# Patient Record
Sex: Female | Born: 1980 | Race: White | Hispanic: No | Smoking: Current every day smoker
Health system: Southern US, Community
[De-identification: ages and names within clinical notes are randomized; demographics above are authoritative.]

## PROBLEM LIST (undated history)

## (undated) ENCOUNTER — Inpatient Hospital Stay (HOSPITAL_COMMUNITY): Payer: Self-pay

## (undated) DIAGNOSIS — R87619 Unspecified abnormal cytological findings in specimens from cervix uteri: Secondary | ICD-10-CM

## (undated) DIAGNOSIS — O039 Complete or unspecified spontaneous abortion without complication: Secondary | ICD-10-CM

## (undated) DIAGNOSIS — F1911 Other psychoactive substance abuse, in remission: Secondary | ICD-10-CM

## (undated) DIAGNOSIS — M5126 Other intervertebral disc displacement, lumbar region: Secondary | ICD-10-CM

## (undated) DIAGNOSIS — J45909 Unspecified asthma, uncomplicated: Secondary | ICD-10-CM

## (undated) DIAGNOSIS — E669 Obesity, unspecified: Secondary | ICD-10-CM

## (undated) DIAGNOSIS — IMO0002 Reserved for concepts with insufficient information to code with codable children: Secondary | ICD-10-CM

## (undated) DIAGNOSIS — E039 Hypothyroidism, unspecified: Secondary | ICD-10-CM

## (undated) HISTORY — PX: CHOLECYSTECTOMY: SHX55

## (undated) HISTORY — PX: OTHER SURGICAL HISTORY: SHX169

---
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
## 2012-08-30 ENCOUNTER — Encounter (HOSPITAL_COMMUNITY): Payer: Self-pay | Admitting: *Deleted

## 2012-08-30 ENCOUNTER — Inpatient Hospital Stay (HOSPITAL_COMMUNITY): Payer: Medicaid Other

## 2012-08-30 ENCOUNTER — Inpatient Hospital Stay (HOSPITAL_COMMUNITY)
Admission: AD | Admit: 2012-08-30 | Discharge: 2012-08-31 | Disposition: A | Discharge status: Home / Self Care | Payer: Medicaid Other | Source: Ambulatory Visit | Attending: Obstetrics & Gynecology | Admitting: Obstetrics & Gynecology

## 2012-08-30 DIAGNOSIS — O209 Hemorrhage in early pregnancy, unspecified: Secondary | ICD-10-CM | POA: Insufficient documentation

## 2012-08-30 HISTORY — DX: Hypothyroidism, unspecified: E03.9

## 2012-08-30 HISTORY — DX: Unspecified abnormal cytological findings in specimens from cervix uteri: R87.619

## 2012-08-30 HISTORY — DX: Reserved for concepts with insufficient information to code with codable children: IMO0002

## 2012-08-30 HISTORY — DX: Other psychoactive substance abuse, in remission: F19.11

## 2012-08-30 HISTORY — DX: Unspecified asthma, uncomplicated: J45.909

## 2012-08-30 LAB — URINALYSIS, ROUTINE W REFLEX MICROSCOPIC
Bilirubin Urine: NEGATIVE
Glucose, UA: NEGATIVE mg/dL
Ketones, ur: NEGATIVE mg/dL
Nitrite: NEGATIVE
Protein, ur: 30 mg/dL — AB
Specific Gravity, Urine: 1.015 (ref 1.005–1.030)
Urobilinogen, UA: 0.2 mg/dL (ref 0.0–1.0)
pH: 7 (ref 5.0–8.0)

## 2012-08-30 LAB — URINE MICROSCOPIC-ADD ON

## 2012-08-30 LAB — CBC
HCT: 36.5 % (ref 36.0–46.0)
Hemoglobin: 12.3 g/dL (ref 12.0–15.0)
MCH: 29 pg (ref 26.0–34.0)
MCHC: 33.7 g/dL (ref 30.0–36.0)

## 2012-08-30 LAB — POCT PREGNANCY, URINE: Preg Test, Ur: POSITIVE — AB

## 2012-08-30 LAB — OB RESULTS CONSOLE GC/CHLAMYDIA: CHLAMYDIA, DNA PROBE: NEGATIVE

## 2012-08-30 NOTE — MAU Note (Signed)
Small amt brown blood on pad in triage

## 2012-08-30 NOTE — MAU Provider Note (Signed)
Chief Complaint: No chief complaint on file.   First Provider Initiated Contact with Patient 08/30/12 2137     SUBJECTIVE HPI: [name] [name] is a 32 y.o. G4P2002 at [redacted]w[redacted]d by uncertain LMP who presents with spotting x 1 week and increased DRB w/ small clots and cramping over the past 5 hours. Denies passage of tissue, abd pain, fever, chills. No recent IC. No prior testing this pregnancy.   Past Medical History  Diagnosis Date  . Hypothyroidism     no meds  . Abnormal Pap smear   . Asthma   . History of drug abuse     codiene   OB History    Grav Para Term Preterm Abortions TAB SAB Ect Mult Living   4 2 2       2      # Outc Date GA Lbr Len/2nd Wgt Sex Del Anes PTL Lv   1 GRA            2 TRM            3 TRM            4 CUR              Past Surgical History  Procedure Date  . Cholecystectomy    History   Social History  . Marital Status: Single    Spouse Name: N/A    Number of Children: N/A  . Years of Education: N/A   Occupational History  . Not on file.   Social History Main Topics  . Smoking status: Current Every Day Smoker    Types: Cigarettes  . Smokeless tobacco: Not on file  . Alcohol Use: No  . Drug Use: Yes    Special: Codeine  . Sexually Active: Yes    Birth Control/ Protection: None   Other Topics Concern  . Not on file   Social History Narrative  . No narrative on file   No current facility-administered medications on file prior to encounter.   No current outpatient prescriptions on file prior to encounter.   Allergies  Allergen Reactions  . Other Nausea And Vomiting    Allergic to anesthesia. Makes patient "violently nauseous"    ROS: Pertinent items in HPI  OBJECTIVE Blood pressure 137/94, pulse 91, temperature 98.1 F (36.7 C), resp. rate 20, height 5' 10.5" (1.791 m), weight 108.682 kg (239 lb 9.6 oz), last menstrual period 06/19/2012. GENERAL: Well-developed, well-nourished female in no acute distress. Anxious.  HEENT:  Normocephalic HEART: normal rate RESP: normal effort ABDOMEN: Soft, non-tender EXTREMITIES: Nontender, no edema NEURO: Alert and oriented SPECULUM EXAM: NEFG, small amount of dark red blood coming from os, normal odor, cervix friable. No mucopurulent discharge. BIMANUAL: cervix closed; UTA uterine size due to body habitus, no adnexal tenderness or masses  LAB RESULTS Results for orders placed during the hospital encounter of 08/30/12 (from the past 24 hour(s))  URINALYSIS, ROUTINE W REFLEX MICROSCOPIC     Status: Abnormal   Collection Time   08/30/12  8:20 PM      Component Value Range   Color, Urine ORANGE (*) YELLOW   APPearance CLEAR  CLEAR   Specific Gravity, Urine 1.015  1.005 - 1.030   pH 7.0  5.0 - 8.0   Glucose, UA NEGATIVE  NEGATIVE mg/dL   Hgb urine dipstick LARGE (*) NEGATIVE   Bilirubin Urine NEGATIVE  NEGATIVE   Ketones, ur NEGATIVE  NEGATIVE mg/dL   Protein, ur 30 (*) NEGATIVE mg/dL  Urobilinogen, UA 0.2  0.0 - 1.0 mg/dL   Nitrite NEGATIVE  NEGATIVE   Leukocytes, UA SMALL (*) NEGATIVE  URINE MICROSCOPIC-ADD ON     Status: Abnormal   Collection Time   08/30/12  8:20 PM      Component Value Range   Squamous Epithelial / LPF FEW (*) RARE   WBC, UA 7-10  <3 WBC/hpf   RBC / HPF 11-20  <3 RBC/hpf   Bacteria, UA FEW (*) RARE  POCT PREGNANCY, URINE     Status: Abnormal   Collection Time   08/30/12  9:18 PM      Component Value Range   Preg Test, Ur POSITIVE (*) NEGATIVE  CBC     Status: Normal   Collection Time   08/30/12  9:38 PM      Component Value Range   WBC 8.2  4.0 - 10.5 K/uL   RBC 4.24  3.87 - 5.11 MIL/uL   Hemoglobin 12.3  12.0 - 15.0 g/dL   HCT 40.9  81.1 - 91.4 %   MCV 86.1  78.0 - 100.0 fL   MCH 29.0  26.0 - 34.0 pg   MCHC 33.7  30.0 - 36.0 g/dL   RDW 78.2  95.6 - 21.3 %   Platelets 251  150 - 400 K/uL  ABO/RH     Status: Normal   Collection Time   08/30/12  9:38 PM      Component Value Range   ABO/RH(D) A POS    HCG, QUANTITATIVE, PREGNANCY      Status: Abnormal   Collection Time   08/30/12  9:38 PM      Component Value Range   hCG, Beta Chain, Quant, S 11086 (*) <5 mIU/mL    IMAGING US Ob Comp Less 14 Wks  08/30/2012  *RADIOLOGY REPORT*  Clinical Data: cramping and bleeding.  OBSTETRIC <14 WK ULTRASOUND  Technique:  Transabdominal ultrasound was performed for evaluation of the gestation as well as the maternal uterus and adnexal regions.  Comparison:  None.  Intrauterine gestational sac: Visualized. Yolk sac: Visualized Embryo: Not visualized Cardiac Activity: Not visualized  MSD: 15.9  mm  6 w  3 d  Maternal uterus/Adnexae: Unremarkable.  IMPRESSION: The absence of visualization of an embryo may be related to early gestational gauge.  The patient has not reached criteria for suspicion for nonviability (mean sac diameter between 16 and 24 mm and no embryo).  Follow up sonogram in 10-14 days and correlation with serial beta HCG is recommended for further delineation.   Original Report Authenticated By: Lacy Duverney, M.D.    US Ob Transvaginal  08/30/2012  *RADIOLOGY REPORT*  Clinical Data: cramping and bleeding.  OBSTETRIC <14 WK ULTRASOUND  Technique:  Transabdominal ultrasound was performed for evaluation of the gestation as well as the maternal uterus and adnexal regions.  Comparison:  None.  Intrauterine gestational sac: Visualized. Yolk sac: Visualized Embryo: Not visualized Cardiac Activity: Not visualized  MSD: 15.9  mm  6 w  3 d  Maternal uterus/Adnexae: Unremarkable.  IMPRESSION: The absence of visualization of an embryo may be related to early gestational gauge.  The patient has not reached criteria for suspicion for nonviability (mean sac diameter between 16 and 24 mm and no embryo).  Follow up sonogram in 10-14 days and correlation with serial beta HCG is recommended for further delineation.   Original Report Authenticated By: Lacy Duverney, M.D.    MAU COURSE  ASSESSMENT 1. Bleeding in early pregnancy  PLAN Discharge  home SAB precautions. Pelvic rest x 1 week.     Follow-up Information    Follow up with THE [facility] OF [place] MATERNITY ADMISSIONS. In 2 days. (for bloodwork)    Contact information:   [address] [id number] mc [place] [address] [phone]      Follow up with THE [facility] OF [place] ULTRASOUND. In 10 days.   Contact information:   [address] [id number] mc [place] [address] [phone]      Follow up with THE [facility] OF [place] MATERNITY ADMISSIONS. (for heavy bleeding and/or severe cramping)    Contact information:   [address] [id number] mc [place] [address] [phone]          Medication List     As of 08/31/2012 12:14 AM    TAKE these medications         buprenorphine 8 MG Subl   Commonly known as: SUBUTEX   Place 8 mg under the tongue daily.      calcium carbonate 500 MG chewable tablet   Commonly known as: TUMS - dosed in mg elemental calcium   Chew 1 tablet by mouth daily as needed. For heartburn        [place], [place] 08/31/2012  12:14 AM

## 2012-08-30 NOTE — MAU Note (Signed)
Have had off and on spotting for a week. For past day more spotting and for past 5 hours more bright blood on the pad. Mild cramping

## 2012-08-31 DIAGNOSIS — O209 Hemorrhage in early pregnancy, unspecified: Secondary | ICD-10-CM

## 2012-08-31 LAB — WET PREP, GENITAL: Yeast Wet Prep HPF POC: NONE SEEN

## 2012-08-31 LAB — GC/CHLAMYDIA PROBE AMP: CT Probe RNA: NEGATIVE

## 2012-09-01 LAB — URINE CULTURE: Colony Count: 7000

## 2012-09-02 ENCOUNTER — Encounter (HOSPITAL_COMMUNITY): Payer: Self-pay | Admitting: Medical

## 2012-09-02 ENCOUNTER — Inpatient Hospital Stay (HOSPITAL_COMMUNITY)
Admission: AD | Admit: 2012-09-02 | Discharge: 2012-09-02 | Disposition: A | Discharge status: Home / Self Care | Payer: Self-pay | Source: Ambulatory Visit | Attending: Obstetrics & Gynecology | Admitting: Obstetrics & Gynecology

## 2012-09-02 DIAGNOSIS — O209 Hemorrhage in early pregnancy, unspecified: Secondary | ICD-10-CM

## 2012-09-02 NOTE — MAU Note (Signed)
After PA reviewed chart, patient does not need a BHCG today and has an appointment on 2-4 for a follow up ultrasound.

## 2012-09-02 NOTE — MAU Provider Note (Signed)
Attestation of Attending Supervision of Advanced Practitioner (CNM/NP): Evaluation and management procedures were performed by the Advanced Practitioner under my supervision and collaboration. I have reviewed the Advanced Practitioner's note and chart, and I agree with the management and plan.  Shelsea Hangartner H. 10:41 PM   

## 2012-09-02 NOTE — MAU Provider Note (Signed)
Subjective: [name] [name] is a 32 y.o. [id number] at [redacted]w[redacted]d who presents to MAU today for follow-up. The patient was seen 2 days ago and had quant hCG and [place] at that time. US showed IUGS and IUYS. The patient already has a follow-up US scheduled for 09/10/12 @ 2:15 pm. The patient is having minimal bleeding today and no abdominal pain. The patient denies fever, N/V. Bleeding has improved since last visit. Patient requests letter for pregnancy confirmation for work. Patient is upset because she was told by the front office staff that she would not be here long because we weren't very busy and states that she waited 45 minutes before being called back to triage. At that time she had not yet had labs drawn, which was why she thought she was here.   Objective: LMP 06/19/2012 GENERAL: Well-developed, well-nourished female in no acute distress.  HEENT: Normocephalic, atraumatic.Marland Kitchen  LUNGS: Normal effort.  HEART: Regular rate.  Very small amount of bleeding seen on pad. Patient has had this pad on x 3-4 hours.   Assessment: IUGS seen on previous US.   Plan: Discharge home Follow-up quant hCG is not needed today since IUGS and IUYS seen on last [place] I have reassured the patient that this is the appropriate follow-up and that we apologize for the wait and inconvenience. The patient is much more understanding and calm prior to discharge.  Patient will follow-up for next [place] as scheduled on 09/09/12 @ 2:15 pm Pregnancy confirmation letter given Bleeding precautions discussed  Freddi Starr, PA-C 09/02/2012 5:10 PM

## 2012-09-10 ENCOUNTER — Encounter (HOSPITAL_COMMUNITY): Payer: Self-pay | Admitting: *Deleted

## 2012-09-10 ENCOUNTER — Ambulatory Visit (HOSPITAL_COMMUNITY)
Admission: RE | Admit: 2012-09-10 | Discharge: 2012-09-10 | Disposition: A | Discharge status: Home / Self Care | Payer: Medicaid Other | Source: Ambulatory Visit | Attending: Advanced Practice Midwife | Admitting: Advanced Practice Midwife

## 2012-09-10 ENCOUNTER — Inpatient Hospital Stay (HOSPITAL_COMMUNITY)
Admission: AD | Admit: 2012-09-10 | Discharge: 2012-09-10 | Disposition: A | Discharge status: Home / Self Care | Payer: Medicaid Other | Source: Ambulatory Visit | Attending: Family Medicine | Admitting: Family Medicine

## 2012-09-10 DIAGNOSIS — Z3689 Encounter for other specified antenatal screening: Secondary | ICD-10-CM | POA: Insufficient documentation

## 2012-09-10 DIAGNOSIS — O209 Hemorrhage in early pregnancy, unspecified: Secondary | ICD-10-CM | POA: Insufficient documentation

## 2012-09-10 DIAGNOSIS — O039 Complete or unspecified spontaneous abortion without complication: Secondary | ICD-10-CM

## 2012-09-10 DIAGNOSIS — O3680X0 Pregnancy with inconclusive fetal viability, not applicable or unspecified: Secondary | ICD-10-CM | POA: Insufficient documentation

## 2012-09-10 NOTE — MAU Provider Note (Signed)
Chart reviewed and agree with management and plan.  

## 2012-09-10 NOTE — MAU Provider Note (Signed)
[name] [name] is a 32 y.o. female who returns to MAU for follow up ultrasound. She continues to have vaginal bleeding but light. On her previous visit  08/30/12 ultrasound showed an IUGS with YS. Ultrasound today shows no IUGS. Consistent with complete SAB  BP 128/81  Pulse 83  Temp 98 F (36.7 C) (Oral)  Resp 20  Ht 5' 10.5" (1.791 m)  Wt 245 lb (111.131 kg)  BMI 34.66 kg/m2  SpO2 100%  LMP 06/19/2012   Assessment: SAB  Plan:  Discussed ultrasound findings with the patient and follow up in the GYN office in 2 weeks   Patient voices understanding   Comfort packet given  I have reviewed this patient's vital signs, nurses notes, appropriate labs and imaging.

## 2012-09-10 NOTE — MAU Note (Signed)
Pt states was seen here last week and had u/s scheduled. Unknown LMP, hx irregular cycles per pt. Bleeding today small-moderate amount, not enough to fill pad, having LLQ pain. Also notes h/a and swelling in legs and hands

## 2012-10-15 ENCOUNTER — Emergency Department (HOSPITAL_COMMUNITY)
Admission: EM | Admit: 2012-10-15 | Discharge: 2012-10-16 | Disposition: A | Discharge status: Home / Self Care | Payer: Medicaid Other | Attending: Emergency Medicine | Admitting: Emergency Medicine

## 2012-10-15 ENCOUNTER — Encounter (HOSPITAL_COMMUNITY): Payer: Self-pay | Admitting: *Deleted

## 2012-10-15 DIAGNOSIS — Z862 Personal history of diseases of the blood and blood-forming organs and certain disorders involving the immune mechanism: Secondary | ICD-10-CM | POA: Insufficient documentation

## 2012-10-15 DIAGNOSIS — J45909 Unspecified asthma, uncomplicated: Secondary | ICD-10-CM | POA: Insufficient documentation

## 2012-10-15 DIAGNOSIS — F1910 Other psychoactive substance abuse, uncomplicated: Secondary | ICD-10-CM | POA: Insufficient documentation

## 2012-10-15 DIAGNOSIS — Z8639 Personal history of other endocrine, nutritional and metabolic disease: Secondary | ICD-10-CM | POA: Insufficient documentation

## 2012-10-15 DIAGNOSIS — O039 Complete or unspecified spontaneous abortion without complication: Secondary | ICD-10-CM | POA: Insufficient documentation

## 2012-10-15 DIAGNOSIS — F17200 Nicotine dependence, unspecified, uncomplicated: Secondary | ICD-10-CM | POA: Insufficient documentation

## 2012-10-15 DIAGNOSIS — N898 Other specified noninflammatory disorders of vagina: Secondary | ICD-10-CM | POA: Insufficient documentation

## 2012-10-15 DIAGNOSIS — R109 Unspecified abdominal pain: Secondary | ICD-10-CM | POA: Insufficient documentation

## 2012-10-15 DIAGNOSIS — Z79899 Other long term (current) drug therapy: Secondary | ICD-10-CM | POA: Insufficient documentation

## 2012-10-15 DIAGNOSIS — R509 Fever, unspecified: Secondary | ICD-10-CM | POA: Insufficient documentation

## 2012-10-15 HISTORY — DX: Complete or unspecified spontaneous abortion without complication: O03.9

## 2012-10-15 NOTE — ED Notes (Signed)
Miscarriage 3 weeks, vaginal discharge off white in color per pt, still with lower abd pain and fever

## 2012-10-16 LAB — URINALYSIS, ROUTINE W REFLEX MICROSCOPIC
Glucose, UA: NEGATIVE mg/dL
Hgb urine dipstick: NEGATIVE
Specific Gravity, Urine: 1.025 (ref 1.005–1.030)
Urobilinogen, UA: 0.2 mg/dL (ref 0.0–1.0)

## 2012-10-16 MED ORDER — AMOXICILLIN 250 MG PO CAPS
ORAL_CAPSULE | ORAL | Status: AC
  Filled 2012-10-16: qty 4

## 2012-10-16 MED ORDER — METRONIDAZOLE 500 MG PO TABS
ORAL_TABLET | ORAL | Status: AC
  Filled 2012-10-16: qty 1

## 2013-01-27 ENCOUNTER — Inpatient Hospital Stay (HOSPITAL_COMMUNITY)
Admission: AD | Admit: 2013-01-27 | Discharge: 2013-01-27 | Disposition: A | Discharge status: Home / Self Care | Payer: Medicaid Other | Source: Ambulatory Visit | Attending: Family Medicine | Admitting: Family Medicine

## 2013-01-27 NOTE — MAU Note (Signed)
Pt not in lobby.  

## 2013-02-05 ENCOUNTER — Inpatient Hospital Stay (HOSPITAL_COMMUNITY)
Admission: AD | Admit: 2013-02-05 | Discharge: 2013-02-05 | Disposition: A | Discharge status: Home / Self Care | Payer: Medicaid Other | Source: Ambulatory Visit | Attending: Obstetrics and Gynecology | Admitting: Obstetrics and Gynecology

## 2013-02-05 ENCOUNTER — Encounter (HOSPITAL_COMMUNITY): Payer: Self-pay

## 2013-02-05 DIAGNOSIS — O210 Mild hyperemesis gravidarum: Secondary | ICD-10-CM | POA: Insufficient documentation

## 2013-02-05 DIAGNOSIS — O219 Vomiting of pregnancy, unspecified: Secondary | ICD-10-CM

## 2013-02-05 DIAGNOSIS — Z3492 Encounter for supervision of normal pregnancy, unspecified, second trimester: Secondary | ICD-10-CM

## 2013-02-05 DIAGNOSIS — R109 Unspecified abdominal pain: Secondary | ICD-10-CM | POA: Insufficient documentation

## 2013-02-05 DIAGNOSIS — Z3201 Encounter for pregnancy test, result positive: Secondary | ICD-10-CM

## 2013-02-05 DIAGNOSIS — R1084 Generalized abdominal pain: Secondary | ICD-10-CM

## 2013-02-05 DIAGNOSIS — R5381 Other malaise: Secondary | ICD-10-CM

## 2013-02-05 DIAGNOSIS — O0930 Supervision of pregnancy with insufficient antenatal care, unspecified trimester: Secondary | ICD-10-CM | POA: Insufficient documentation

## 2013-02-05 LAB — URINE MICROSCOPIC-ADD ON

## 2013-02-05 LAB — URINALYSIS, ROUTINE W REFLEX MICROSCOPIC
Glucose, UA: NEGATIVE mg/dL
Protein, ur: NEGATIVE mg/dL

## 2013-02-05 LAB — COMPREHENSIVE METABOLIC PANEL
ALT: 7 U/L (ref 0–35)
AST: 12 U/L (ref 0–37)
Albumin: 2.8 g/dL — ABNORMAL LOW (ref 3.5–5.2)
Alkaline Phosphatase: 57 U/L (ref 39–117)
BUN: 7 mg/dL (ref 6–23)
Potassium: 3.3 mEq/L — ABNORMAL LOW (ref 3.5–5.1)
Sodium: 136 mEq/L (ref 135–145)
Total Protein: 6.2 g/dL (ref 6.0–8.3)

## 2013-02-05 LAB — CBC
Hemoglobin: 11.6 g/dL — ABNORMAL LOW (ref 12.0–15.0)
MCHC: 35 g/dL (ref 30.0–36.0)
RDW: 13.4 % (ref 11.5–15.5)

## 2013-02-05 LAB — POCT PREGNANCY, URINE: Preg Test, Ur: POSITIVE — AB

## 2013-02-05 MED ORDER — PRENATAL COMPLETE 14-0.4 MG PO TABS: 1.0000 | ORAL_TABLET | Freq: Every day | ORAL | Status: DC

## 2013-02-05 MED ORDER — ONDANSETRON 8 MG PO TBDP
8.0000 mg | ORAL_TABLET | Freq: Once | ORAL | Status: AC
  Administered 2013-02-05: 8 mg via ORAL
  Filled 2013-02-05: qty 1

## 2013-02-05 MED ORDER — ONDANSETRON HCL 4 MG PO TABS: 4.0000 mg | ORAL_TABLET | Freq: Four times a day (QID) | ORAL | Status: DC

## 2013-02-05 MED ORDER — POTASSIUM CHLORIDE ER 10 MEQ PO TBCR: 20.0000 meq | EXTENDED_RELEASE_TABLET | Freq: Two times a day (BID) | ORAL | Status: DC

## 2013-02-05 MED ORDER — PROMETHAZINE HCL 25 MG PO TABS: 25.0000 mg | ORAL_TABLET | Freq: Four times a day (QID) | ORAL | Status: DC | PRN

## 2013-02-05 NOTE — MAU Provider Note (Signed)
History     CSN: [id number]  Arrival date and time: 02/05/13 1846   First Provider Initiated Contact with Patient 02/05/13 1935      Chief Complaint  Patient presents with  . Abdominal Pain  . Nausea  . Fatigue  . Amenorrhea   HPI 32 y.o. [id number] at [redacted]w[redacted]d by unsure LMP. Presents to MAU with fatigue, n/v x several weeks. Also c/o sharp low abdominal pain when walking. + UPT at home in April. Feeling fetal movement x 2-3 weeks. No prenatal care yet. Pt is on Subutex for narcotic dependence - was planning to wean off, but she has discussed pregnancy with her MD and plans to stop subutex after pregnancy. Was previously dependent on narcotic pain meds for back pain, states her back pain is better now and she does not feel she requires meds for pain relief.   Past Medical History  Diagnosis Date  . Hypothyroidism     no meds  . Abnormal Pap smear   . Asthma   . History of drug abuse     codiene  . Miscarriage     Past Surgical History  Procedure Laterality Date  . Cholecystectomy      Family History  Problem Relation Age of Onset  . Other Neg Hx     History  Substance Use Topics  . Smoking status: Current Every Day Smoker -- 0.50 packs/day    Types: Cigarettes  . Smokeless tobacco: Current User  . Alcohol Use: No    Allergies:  Allergies  Allergen Reactions  . Other Nausea And Vomiting    Allergic to anesthesia. Makes patient "violently nauseous"    Prescriptions prior to admission  Medication Sig Dispense Refill  . albuterol (PROVENTIL HFA;VENTOLIN HFA) 108 (90 BASE) MCG/ACT inhaler Inhale 2 puffs into the lungs every 6 (six) hours as needed (asthma).      . buprenorphine (SUBUTEX) 8 MG SUBL Place 8 mg under the tongue 3 (three) times daily as needed (pain).         Review of Systems  Constitutional: Positive for malaise/fatigue.  Respiratory: Negative.   Cardiovascular: Negative.   Gastrointestinal: Positive for nausea, vomiting and abdominal pain.  Negative for diarrhea and constipation.  Genitourinary: Negative for dysuria, urgency, frequency, hematuria and flank pain.       Negative for vaginal bleeding, cramping/contractions  Musculoskeletal: Negative.   Neurological: Negative.   Psychiatric/Behavioral: Negative.    Physical Exam   Height 5\' 9"  (1.753 m), weight 250 lb 4 oz (113.513 kg), last menstrual period 10/19/2012, unknown if currently breastfeeding.  Physical Exam  Nursing note and vitals reviewed. Constitutional: She is oriented to person, place, and time. She appears well-developed and well-nourished. No distress.  Obese   Cardiovascular: Normal rate.   Respiratory: Effort normal.  GI: Soft. There is no tenderness.  16-17 week fundal height, exam limited by body habitus    Musculoskeletal: Normal range of motion.  Neurological: She is alert and oriented to person, place, and time.  Skin: Skin is warm and dry.  Psychiatric: She has a normal mood and affect.   FHR 140 MAU Course  Procedures Results for orders placed during the hospital encounter of 02/05/13 (from the past 72 hour(s))  URINALYSIS, ROUTINE W REFLEX MICROSCOPIC     Status: Abnormal   Collection Time    02/05/13  7:00 PM      Result Value Range   Color, Urine YELLOW  YELLOW   APPearance HAZY (*) CLEAR  Specific Gravity, Urine 1.015  1.005 - 1.030   pH 7.0  5.0 - 8.0   Glucose, UA NEGATIVE  NEGATIVE mg/dL   Hgb urine dipstick NEGATIVE  NEGATIVE   Bilirubin Urine NEGATIVE  NEGATIVE   Ketones, ur NEGATIVE  NEGATIVE mg/dL   Protein, ur NEGATIVE  NEGATIVE mg/dL   Urobilinogen, UA 0.2  0.0 - 1.0 mg/dL   Nitrite NEGATIVE  NEGATIVE   Leukocytes, UA MODERATE (*) NEGATIVE  URINE MICROSCOPIC-ADD ON     Status: None   Collection Time    02/05/13  7:00 PM      Result Value Range   Squamous Epithelial / LPF RARE  RARE   WBC, UA 0-2  <3 WBC/hpf   RBC / HPF 0-2  <3 RBC/hpf   Bacteria, UA RARE  RARE   Urine-Other AMORPHOUS URATES/PHOSPHATES    POCT  PREGNANCY, URINE     Status: Abnormal   Collection Time    02/05/13  7:16 PM      Result Value Range   Preg Test, Ur POSITIVE (*) NEGATIVE   Comment:            THE SENSITIVITY OF THIS     METHODOLOGY IS >24 mIU/mL  CBC     Status: Abnormal   Collection Time    02/05/13  7:35 PM      Result Value Range   WBC 8.2  4.0 - 10.5 K/uL   RBC 3.92  3.87 - 5.11 MIL/uL   Hemoglobin 11.6 (*) 12.0 - 15.0 g/dL   HCT 91.4 (*) 78.2 - 95.6 %   MCV 84.4  78.0 - 100.0 fL   MCH 29.6  26.0 - 34.0 pg   MCHC 35.0  30.0 - 36.0 g/dL   RDW 21.3  08.6 - 57.8 %   Platelets 226  150 - 400 K/uL  COMPREHENSIVE METABOLIC PANEL     Status: Abnormal   Collection Time    02/05/13  7:35 PM      Result Value Range   Sodium 136  135 - 145 mEq/L   Potassium 3.3 (*) 3.5 - 5.1 mEq/L   Chloride 100  96 - 112 mEq/L   CO2 26  19 - 32 mEq/L   Glucose, Bld 97  70 - 99 mg/dL   BUN 7  6 - 23 mg/dL   Creatinine, Ser 4.69  0.50 - 1.10 mg/dL   Calcium 9.6  8.4 - 62.9 mg/dL   Total Protein 6.2  6.0 - 8.3 g/dL   Albumin 2.8 (*) 3.5 - 5.2 g/dL   AST 12  0 - 37 U/L   ALT 7  0 - 35 U/L   Alkaline Phosphatase 57  39 - 117 U/L   Total Bilirubin 0.1 (*) 0.3 - 1.2 mg/dL   GFR calc non Af Amer >90  >90 mL/min   GFR calc Af Amer >90  >90 mL/min   Comment:            The eGFR has been calculated     using the CKD EPI equation.     This calculation has not been     validated in all clinical     situations.     eGFR's persistently     <90 mL/min signify     possible Chronic Kidney Disease.     Assessment and Plan  32 y.o. [id number] at [redacted]w[redacted]d  CBC, CMP, urinalysis pending Plan for outpatient ultrasound, prenatal care in [facility] clinic Care assumed  by Joseph Berkshire, PA-C  ETHIER, JULIE N. 02/05/2013, 8:19 PM   2000 - Results pending. Care assumed from Georges Mouse, CNM  Results for orders placed during the hospital encounter of 02/05/13 (from the past 24 hour(s))  URINALYSIS, ROUTINE W REFLEX MICROSCOPIC     Status: Abnormal    Collection Time    02/05/13  7:00 PM      Result Value Range   Color, Urine YELLOW  YELLOW   APPearance HAZY (*) CLEAR   Specific Gravity, Urine 1.015  1.005 - 1.030   pH 7.0  5.0 - 8.0   Glucose, UA NEGATIVE  NEGATIVE mg/dL   Hgb urine dipstick NEGATIVE  NEGATIVE   Bilirubin Urine NEGATIVE  NEGATIVE   Ketones, ur NEGATIVE  NEGATIVE mg/dL   Protein, ur NEGATIVE  NEGATIVE mg/dL   Urobilinogen, UA 0.2  0.0 - 1.0 mg/dL   Nitrite NEGATIVE  NEGATIVE   Leukocytes, UA MODERATE (*) NEGATIVE  URINE MICROSCOPIC-ADD ON     Status: None   Collection Time    02/05/13  7:00 PM      Result Value Range   Squamous Epithelial / LPF RARE  RARE   WBC, UA 0-2  <3 WBC/hpf   RBC / HPF 0-2  <3 RBC/hpf   Bacteria, UA RARE  RARE   Urine-Other AMORPHOUS URATES/PHOSPHATES    POCT PREGNANCY, URINE     Status: Abnormal   Collection Time    02/05/13  7:16 PM      Result Value Range   Preg Test, Ur POSITIVE (*) NEGATIVE  CBC     Status: Abnormal   Collection Time    02/05/13  7:35 PM      Result Value Range   WBC 8.2  4.0 - 10.5 K/uL   RBC 3.92  3.87 - 5.11 MIL/uL   Hemoglobin 11.6 (*) 12.0 - 15.0 g/dL   HCT 16.1 (*) 09.6 - 04.5 %   MCV 84.4  78.0 - 100.0 fL   MCH 29.6  26.0 - 34.0 pg   MCHC 35.0  30.0 - 36.0 g/dL   RDW 40.9  81.1 - 91.4 %   Platelets 226  150 - 400 K/uL  COMPREHENSIVE METABOLIC PANEL     Status: Abnormal   Collection Time    02/05/13  7:35 PM      Result Value Range   Sodium 136  135 - 145 mEq/L   Potassium 3.3 (*) 3.5 - 5.1 mEq/L   Chloride 100  96 - 112 mEq/L   CO2 26  19 - 32 mEq/L   Glucose, Bld 97  70 - 99 mg/dL   BUN 7  6 - 23 mg/dL   Creatinine, Ser 7.82  0.50 - 1.10 mg/dL   Calcium 9.6  8.4 - 95.6 mg/dL   Total Protein 6.2  6.0 - 8.3 g/dL   Albumin 2.8 (*) 3.5 - 5.2 g/dL   AST 12  0 - 37 U/L   ALT 7  0 - 35 U/L   Alkaline Phosphatase 57  39 - 117 U/L   Total Bilirubin 0.1 (*) 0.3 - 1.2 mg/dL   GFR calc non Af Amer >90  >90 mL/min   GFR calc Af Amer >90  >90  mL/min   MDM Patient reports some improvement with Zofran and requests discharge.   A: Nausea and vomiting in pregnancy Late prenatal care Mild hypokalemia  P: Discharge home Rx for Phenergan, Zofran and K+ supplement sent to patient's pharmacy  Referral to [facility] clinic for prenatal care sent. Medicaid assistance information given.  Patient given pregnancy confirmation letter.  Patient may return to MAU as needed or if her condition were to change or worsen.   Freddi Starr, PA-C 02/05/2013 8:19 PM

## 2013-02-05 NOTE — MAU Note (Signed)
Patient is with c/o abdominal pain, nausea and " extreme tiredness" for a few months. She denies vaginal bleeding. lmp 10/19/12. Patient states that she had a miscarriage but think she is newly pregnant since then.

## 2013-02-06 NOTE — MAU Provider Note (Signed)
Attestation of Attending Supervision of Advanced Practitioner (CNM/NP): Evaluation and management procedures were performed by the Advanced Practitioner under my supervision and collaboration.  I have reviewed the Advanced Practitioner's note and chart, and I agree with the management and plan.  Catalaya Garr 02/06/2013 7:15 AM

## 2013-02-19 ENCOUNTER — Ambulatory Visit (HOSPITAL_COMMUNITY)
Admission: RE | Admit: 2013-02-19 | Discharge: 2013-02-19 | Disposition: A | Discharge status: Home / Self Care | Payer: Medicaid Other | Source: Ambulatory Visit | Attending: Advanced Practice Midwife | Admitting: Advanced Practice Midwife

## 2013-02-19 DIAGNOSIS — Z3492 Encounter for supervision of normal pregnancy, unspecified, second trimester: Secondary | ICD-10-CM

## 2013-02-19 DIAGNOSIS — O365990 Maternal care for other known or suspected poor fetal growth, unspecified trimester, not applicable or unspecified: Secondary | ICD-10-CM | POA: Insufficient documentation

## 2013-02-19 DIAGNOSIS — O358XX0 Maternal care for other (suspected) fetal abnormality and damage, not applicable or unspecified: Secondary | ICD-10-CM | POA: Insufficient documentation

## 2013-02-19 DIAGNOSIS — O99340 Other mental disorders complicating pregnancy, unspecified trimester: Secondary | ICD-10-CM | POA: Insufficient documentation

## 2013-02-19 DIAGNOSIS — Z1389 Encounter for screening for other disorder: Secondary | ICD-10-CM | POA: Insufficient documentation

## 2013-02-19 DIAGNOSIS — Z363 Encounter for antenatal screening for malformations: Secondary | ICD-10-CM | POA: Insufficient documentation

## 2013-02-19 DIAGNOSIS — F1910 Other psychoactive substance abuse, uncomplicated: Secondary | ICD-10-CM | POA: Insufficient documentation

## 2013-02-24 ENCOUNTER — Encounter: Payer: Medicaid Other | Admitting: Obstetrics and Gynecology

## 2013-03-02 ENCOUNTER — Encounter (HOSPITAL_COMMUNITY): Payer: Self-pay | Admitting: Advanced Practice Midwife

## 2013-03-31 ENCOUNTER — Encounter: Payer: Self-pay | Admitting: Family Medicine

## 2013-03-31 ENCOUNTER — Ambulatory Visit (INDEPENDENT_AMBULATORY_CARE_PROVIDER_SITE_OTHER): Payer: Medicaid Other | Admitting: Family Medicine

## 2013-03-31 ENCOUNTER — Other Ambulatory Visit (HOSPITAL_COMMUNITY)
Admission: RE | Admit: 2013-03-31 | Discharge: 2013-03-31 | Disposition: A | Discharge status: Home / Self Care | Payer: Medicaid Other | Source: Ambulatory Visit | Attending: Family Medicine | Admitting: Family Medicine

## 2013-03-31 VITALS — BP 128/77 | Temp 97.3°F | Wt 257.0 lb

## 2013-03-31 DIAGNOSIS — Z113 Encounter for screening for infections with a predominantly sexual mode of transmission: Secondary | ICD-10-CM | POA: Insufficient documentation

## 2013-03-31 DIAGNOSIS — E669 Obesity, unspecified: Secondary | ICD-10-CM

## 2013-03-31 DIAGNOSIS — J45909 Unspecified asthma, uncomplicated: Secondary | ICD-10-CM | POA: Insufficient documentation

## 2013-03-31 DIAGNOSIS — O99330 Smoking (tobacco) complicating pregnancy, unspecified trimester: Secondary | ICD-10-CM

## 2013-03-31 DIAGNOSIS — F1920 Other psychoactive substance dependence, uncomplicated: Secondary | ICD-10-CM

## 2013-03-31 DIAGNOSIS — O99332 Smoking (tobacco) complicating pregnancy, second trimester: Secondary | ICD-10-CM

## 2013-03-31 DIAGNOSIS — Z01419 Encounter for gynecological examination (general) (routine) without abnormal findings: Secondary | ICD-10-CM | POA: Insufficient documentation

## 2013-03-31 DIAGNOSIS — Z1151 Encounter for screening for human papillomavirus (HPV): Secondary | ICD-10-CM | POA: Insufficient documentation

## 2013-03-31 DIAGNOSIS — O0990 Supervision of high risk pregnancy, unspecified, unspecified trimester: Secondary | ICD-10-CM

## 2013-03-31 DIAGNOSIS — O99212 Obesity complicating pregnancy, second trimester: Secondary | ICD-10-CM

## 2013-03-31 DIAGNOSIS — O99322 Drug use complicating pregnancy, second trimester: Secondary | ICD-10-CM | POA: Insufficient documentation

## 2013-03-31 LAB — POCT URINALYSIS DIP (DEVICE)
Nitrite: NEGATIVE
Protein, ur: NEGATIVE mg/dL
Urobilinogen, UA: 0.2 mg/dL (ref 0.0–1.0)

## 2013-03-31 MED ORDER — ALBUTEROL SULFATE HFA 108 (90 BASE) MCG/ACT IN AERS: 2.0000 | INHALATION_SPRAY | Freq: Four times a day (QID) | RESPIRATORY_TRACT | Status: AC | PRN

## 2013-03-31 MED ORDER — OMEPRAZOLE MAGNESIUM 20 MG PO TBEC: 20.0000 mg | DELAYED_RELEASE_TABLET | Freq: Every day | ORAL | Status: DC

## 2013-03-31 MED ORDER — ONDANSETRON 4 MG PO TBDP: 4.0000 mg | ORAL_TABLET | Freq: Four times a day (QID) | ORAL | Status: DC | PRN

## 2013-03-31 NOTE — Progress Notes (Signed)
Subjective:    [name] [name] is a [id number] [redacted]w[redacted]d being seen today for her first obstetrical visit.  Her obstetrical history is significant for obesity and late for prenatal care. Patient does intend to breast feed. Pregnancy history fully reviewed.  Patient reports nausea. She also reports h/o needing 50 mcg of levothyroxine from age 32 until last pregnancy when she was told she no longer needed it.    Filed Vitals:   03/31/13 0837  BP: 128/77  Temp: 97.3 F (36.3 C)  Weight: 257 lb (116.574 kg)    HISTORY: OB History  Gravida Para Term Preterm AB SAB TAB Ectopic Multiple Living  5 2 2  2 2    2     # Outcome Date GA Lbr Len/2nd Weight Sex Delivery Anes PTL Lv  5 CUR           4 TRM 12/16/11   9 lb 4 oz (4.196 kg) M SVD     3 TRM 06/23/07   8 lb 2 oz (3.685 kg) F SVD     2 SAB           1 SAB              Comments: System Generated. Please review and update pregnancy details.     Past Medical History  Diagnosis Date  . Hypothyroidism     no meds  . Abnormal Pap smear   . Asthma   . History of drug abuse     codiene  . Miscarriage    Past Surgical History  Procedure Laterality Date  . Cholecystectomy    . Myringostomy Bilateral    Family History  Problem Relation Age of Onset  . Other Neg Hx   . Cancer Mother     lung  . Diabetes Mother   . Diabetes Maternal Aunt   . Diabetes Maternal Grandmother   . Cancer Maternal Grandfather     lung  . Diabetes Paternal Grandmother   . Diabetes Paternal Grandfather      Exam    Uterus:   22 wk size  Pelvic Exam:    Perineum: Normal Perineum   Vulva: Bartholin's, Urethra, Skene's normal   Vagina:  normal mucosa, normal discharge   Cervix: multiparous appearance, no cervical motion tenderness and no lesions   Adnexa: not evaluated   Bony Pelvis: average  System: Breast:  normal appearance, no masses or tenderness   Skin: normal coloration and turgor, no rashes    Neurologic: oriented   Extremities: normal  strength, tone, and muscle mass   HEENT sclera clear, anicteric   Mouth/Teeth mucous membranes moist, pharynx normal without lesions   Neck supple   Cardiovascular: regular rate and rhythm   Respiratory:  appears well, vitals normal, no respiratory distress, acyanotic, normal RR, ear and throat exam is normal, neck free of mass or lymphadenopathy, chest clear, no wheezing, crepitations, rhonchi, normal symmetric air entry   Abdomen: soft, non-tender; bowel sounds normal; no masses,  no organomegaly      Assessment:    Pregnancy: [id number] Patient Active Problem List   Diagnosis Date Noted  . Supervision of high-risk pregnancy 03/31/2013    Priority: High  . Maternal drug use complicating pregnancy in second trimester, antepartum 03/31/2013    Priority: Medium  . Obesity complicating pregnancy in second trimester 03/31/2013    Priority: Medium  . Asthma in adult 03/31/2013        Plan:     Initial labs  drawn. Prenatal vitamins. Problem list reviewed and updated. Genetic Screening discussed Quad Screen: too late.  Ultrasound discussed; fetal survey: ordered.  Follow up in 4 weeks. New OB labs with TSH.     PRATT,TANYA S 03/31/2013

## 2013-03-31 NOTE — Patient Instructions (Signed)
Pregnancy - Second Trimester The second trimester of pregnancy (3 to 6 months) is a period of rapid growth for you and your baby. At the end of the sixth month, your baby is about 9 inches long and weighs 1 1/2 pounds. You will begin to feel the baby move between 18 and 20 weeks of the pregnancy. This is called quickening. Weight gain is faster. A clear fluid (colostrum) may leak out of your breasts. You may feel small contractions of the womb (uterus). This is known as false labor or Braxton-Hicks contractions. This is like a practice for labor when the baby is ready to be born. Usually, the problems with morning sickness have usually passed by the end of your first trimester. Some women develop small dark blotches (called cholasma, mask of pregnancy) on their face that usually goes away after the baby is born. Exposure to the sun makes the blotches worse. Acne may also develop in some pregnant women and pregnant women who have acne, may find that it goes away. PRENATAL EXAMS  Blood work may continue to be done during prenatal exams. These tests are done to check on your health and the probable health of your baby. Blood work is used to follow your blood levels (hemoglobin). Anemia (low hemoglobin) is common during pregnancy. Iron and vitamins are given to help prevent this. You will also be checked for diabetes between 24 and 28 weeks of the pregnancy. Some of the previous blood tests may be repeated.  The size of the uterus is measured during each visit. This is to make sure that the baby is continuing to grow properly according to the dates of the pregnancy.  Your blood pressure is checked every prenatal visit. This is to make sure you are not getting toxemia.  Your urine is checked to make sure you do not have an infection, diabetes or protein in the urine.  Your weight is checked often to make sure gains are happening at the suggested rate. This is to ensure that both you and your baby are  growing normally.  Sometimes, an ultrasound is performed to confirm the proper growth and development of the baby. This is a test which bounces harmless sound waves off the baby so your caregiver can more accurately determine due dates. Sometimes, a test is done on the amniotic fluid surrounding the baby. This test is called an amniocentesis. The amniotic fluid is obtained by sticking a needle into the belly (abdomen). This is done to check the chromosomes in instances where there is a concern about possible genetic problems with the baby. It is also sometimes done near the end of pregnancy if an early delivery is required. In this case, it is done to help make sure the baby's lungs are mature enough for the baby to live outside of the womb. CHANGES OCCURING IN THE SECOND TRIMESTER OF PREGNANCY Your body goes through many changes during pregnancy. They vary from person to person. Talk to your caregiver about changes you notice that you are concerned about.  During the second trimester, you will likely have an increase in your appetite. It is normal to have cravings for certain foods. This varies from person to person and pregnancy to pregnancy.  Your lower abdomen will begin to bulge.  You may have to urinate more often because the uterus and baby are pressing on your bladder. It is also common to get more bladder infections during pregnancy. You can help this by drinking lots of fluids   and emptying your bladder before and after intercourse.  You may begin to get stretch marks on your hips, abdomen, and breasts. These are normal changes in the body during pregnancy. There are no exercises or medicines to take that prevent this change.  You may begin to develop swollen and bulging veins (varicose veins) in your legs. Wearing support hose, elevating your feet for 15 minutes, 3 to 4 times a day and limiting salt in your diet helps lessen the problem.  Heartburn may develop as the uterus grows and  pushes up against the stomach. Antacids recommended by your caregiver helps with this problem. Also, eating smaller meals 4 to 5 times a day helps.  Constipation can be treated with a stool softener or adding bulk to your diet. Drinking lots of fluids, and eating vegetables, fruits, and whole grains are helpful.  Exercising is also helpful. If you have been very active up until your pregnancy, most of these activities can be continued during your pregnancy. If you have been less active, it is helpful to start an exercise program such as walking.  Hemorrhoids may develop at the end of the second trimester. Warm sitz baths and hemorrhoid cream recommended by your caregiver helps hemorrhoid problems.  Backaches may develop during this time of your pregnancy. Avoid heavy lifting, wear low heal shoes, and practice good posture to help with backache problems.  Some pregnant women develop tingling and numbness of their hand and fingers because of swelling and tightening of ligaments in the wrist (carpel tunnel syndrome). This goes away after the baby is born.  As your breasts enlarge, you may have to get a bigger bra. Get a comfortable, cotton, support bra. Do not get a nursing bra until the last month of the pregnancy if you will be nursing the baby.  You may get a dark line from your belly button to the pubic area called the linea nigra.  You may develop rosy cheeks because of increase blood flow to the face.  You may develop spider looking lines of the face, neck, arms, and chest. These go away after the baby is born. HOME CARE INSTRUCTIONS   It is extremely important to avoid all smoking, herbs, alcohol, and unprescribed drugs during your pregnancy. These chemicals affect the formation and growth of the baby. Avoid these chemicals throughout the pregnancy to ensure the delivery of a healthy infant.  Most of your home care instructions are the same as suggested for the first trimester of your  pregnancy. Keep your caregiver's appointments. Follow your caregiver's instructions regarding medicine use, exercise, and diet.  During pregnancy, you are providing food for you and your baby. Continue to eat regular, well-balanced meals. Choose foods such as meat, fish, milk and other low fat dairy products, vegetables, fruits, and whole-grain breads and cereals. Your caregiver will tell you of the ideal weight gain.  A physical sexual relationship may be continued up until near the end of pregnancy if there are no other problems. Problems could include early (premature) leaking of amniotic fluid from the membranes, vaginal bleeding, abdominal pain, or other medical or pregnancy problems.  Exercise regularly if there are no restrictions. Check with your caregiver if you are unsure of the safety of some of your exercises. The greatest weight gain will occur in the last 2 trimesters of pregnancy. Exercise will help you:  Control your weight.  Get you in shape for labor and delivery.  Lose weight after you have the baby.  Wear   a good support or jogging bra for breast tenderness during pregnancy. This may help if worn during sleep. Pads or tissues may be used in the bra if you are leaking colostrum.  Do not use hot tubs, steam rooms or saunas throughout the pregnancy.  Wear your seat belt at all times when driving. This protects you and your baby if you are in an accident.  Avoid raw meat, uncooked cheese, cat litter boxes, and soil used by cats. These carry germs that can cause birth defects in the baby.  The second trimester is also a good time to visit your dentist for your dental health if this has not been done yet. Getting your teeth cleaned is okay. Use a soft toothbrush. Brush gently during pregnancy.  It is easier to leak urine during pregnancy. Tightening up and strengthening the pelvic muscles will help with this problem. Practice stopping your urination while you are going to the  bathroom. These are the same muscles you need to strengthen. It is also the muscles you would use as if you were trying to stop from passing gas. You can practice tightening these muscles up 10 times a set and repeating this about 3 times per day. Once you know what muscles to tighten up, do not perform these exercises during urination. It is more likely to contribute to an infection by backing up the urine.  Ask for help if you have financial, counseling, or nutritional needs during pregnancy. Your caregiver will be able to offer counseling for these needs as well as refer you for other special needs.  Your skin may become oily. If so, wash your face with mild soap, use non-greasy moisturizer and oil or cream based makeup. MEDICINES AND DRUG USE IN PREGNANCY  Take prenatal vitamins as directed. The vitamin should contain 1 milligram of folic acid. Keep all vitamins out of reach of children. Only a couple vitamins or tablets containing iron may be fatal to a baby or young child when ingested.  Avoid use of all medicines, including herbs, over-the-counter medicines, not prescribed or suggested by your caregiver. Only take over-the-counter or prescription medicines for pain, discomfort, or fever as directed by your caregiver. Do not use aspirin.  Let your caregiver also know about herbs you may be using.  Alcohol is related to a number of birth defects. This includes fetal alcohol syndrome. All alcohol, in any form, should be avoided completely. Smoking will cause low birth rate and premature babies.  Street or illegal drugs are very harmful to the baby. They are absolutely forbidden. A baby born to an addicted mother will be addicted at birth. The baby will go through the same withdrawal an adult does. SEEK MEDICAL CARE IF:  You have any concerns or worries during your pregnancy. It is better to call with your questions if you feel they cannot wait, rather than worry about them. SEEK IMMEDIATE  MEDICAL CARE IF:   An unexplained oral temperature above 102 F (38.9 C) develops, or as your caregiver suggests.  You have leaking of fluid from the vagina (birth canal). If leaking membranes are suspected, take your temperature and tell your caregiver of this when you call.  There is vaginal spotting, bleeding, or passing clots. Tell your caregiver of the amount and how many pads are used. Light spotting in pregnancy is common, especially following intercourse.  You develop a bad smelling vaginal discharge with a change in the color from clear to white.  You continue to feel   sick to your stomach (nauseated) and have no relief from remedies suggested. You vomit blood or coffee ground-like materials.  You lose more than 2 pounds of weight or gain more than 2 pounds of weight over 1 week, or as suggested by your caregiver.  You notice swelling of your face, hands, feet, or legs.  You get exposed to German measles and have never had them.  You are exposed to fifth disease or chickenpox.  You develop belly (abdominal) pain. Round ligament discomfort is a common non-cancerous (benign) cause of abdominal pain in pregnancy. Your caregiver still must evaluate you.  You develop a bad headache that does not go away.  You develop fever, diarrhea, pain with urination, or shortness of breath.  You develop visual problems, blurry, or double vision.  You fall or are in a car accident or any kind of trauma.  There is mental or physical violence at home. Document Released: 07/18/2001 Document Revised: 04/17/2012 Document Reviewed: 01/20/2009 [employer] Patient Information 2014 [employer].  Breastfeeding A change in hormones during your pregnancy causes growth of your breast tissue and an increase in number and size of milk ducts. The hormone prolactin allows proteins, sugars, and fats from your blood supply to make breast milk in your milk-producing glands. The hormone progesterone prevents  breast milk from being released before the birth of your baby. After the birth of your baby, your progesterone level decreases allowing breast milk to be released. Thoughts of your baby, as well as his or her sucking or crying, can stimulate the release of milk from the milk-producing glands. Deciding to breastfeed (nurse) is one of the best choices you can make for you and your baby. The information that follows gives a brief review of the benefits, as well as other important skills to know about breastfeeding. BENEFITS OF BREASTFEEDING For your baby  The first milk (colostrum) helps your baby's digestive system function better.   There are antibodies in your milk that help your baby fight off infections.   Your baby has a lower incidence of asthma, allergies, and sudden infant death syndrome (SIDS).   The nutrients in breast milk are better for your baby than infant formulas.  Breast milk improves your baby's brain development.   Your baby will have less gas, colic, and constipation.  Your baby is less likely to develop other conditions, such as childhood obesity, asthma, or diabetes mellitus. For you  Breastfeeding helps develop a very special bond between you and your baby.   Breastfeeding is convenient, always available at the correct temperature, and costs nothing.   Breastfeeding helps to burn calories and helps you lose the weight gained during pregnancy.   Breastfeeding makes your uterus contract back down to normal size faster and slows bleeding following delivery.   Breastfeeding mothers have a lower risk of developing osteoporosis or breast or ovarian cancer later in life.  BREASTFEEDING FREQUENCY  A healthy, full-term baby may breastfeed as often as every hour or space his or her feedings to every 3 hours. Breastfeeding frequency will vary from baby to baby.   Newborns should be fed no less than every 2 3 hours during the day and every 4 5 hours during the  night. You should breastfeed a minimum of 8 feedings in a 24 hour period.  Awaken your baby to breastfeed if it has been 3 4 hours since the last feeding.  Breastfeed when you feel the need to reduce the fullness of your breasts or when   your newborn shows signs of hunger. Signs that your baby may be hungry include:  Increased alertness or activity.  Stretching.  Movement of the head from side to side.  Movement of the head and opening of the mouth when the corner of the mouth or cheek is stroked (rooting).  Increased sucking sounds, smacking lips, cooing, sighing, or squeaking.  Hand-to-mouth movements.  Increased sucking of fingers or hands.  Fussing.  Intermittent crying.  Signs of extreme hunger will require calming and consoling before you try to feed your baby. Signs of extreme hunger may include:  Restlessness.  A loud, strong cry.  Screaming.  Frequent feeding will help you make more milk and will help prevent problems, such as sore nipples and engorgement of the breasts.  BREASTFEEDING   Whether lying down or sitting, be sure that the baby's abdomen is facing your abdomen.   Support your breast with 4 fingers under your breast and your thumb above your nipple. Make sure your fingers are well away from your nipple and your baby's mouth.   Stroke your baby's lips gently with your finger or nipple.   When your baby's mouth is open wide enough, place all of your nipple and as much of the colored area around your nipple (areola) as possible into your baby's mouth.  More areola should be visible above his or her upper lip than below his or her lower lip.  Your baby's tongue should be between his or her lower gum and your breast.  Ensure that your baby's mouth is correctly positioned around the nipple (latched). Your baby's lips should create a seal on your breast.  Signs that your baby has effectively latched onto your nipple include:  Tugging or sucking  without pain.  Swallowing heard between sucks.  Absent click or smacking sound.  Muscle movement above and in front of his or her ears with sucking.  Your baby must suck about 2 3 minutes in order to get your milk. Allow your baby to feed on each breast as long as he or she wants. Nurse your baby until he or she unlatches or falls asleep at the first breast, then offer the second breast.  Signs that your baby is full and satisfied include:  A gradual decrease in the number of sucks or complete cessation of sucking.  Falling asleep.  Extension or relaxation of his or her body.  Retention of a small amount of milk in his or her mouth.  Letting go of your breast by himself or herself.  Signs of effective breastfeeding in you include:  Breasts that have increased firmness, weight, and size prior to feeding.  Breasts that are softer after nursing.  Increased milk volume, as well as a change in milk consistency and color by the 5th day of breastfeeding.  Breast fullness relieved by breastfeeding.  Nipples are not sore, cracked, or bleeding.  If needed, break the suction by putting your finger into the corner of your baby's mouth and sliding your finger between his or her gums. Then, remove your breast from his or her mouth.  It is common for babies to spit up a small amount after a feeding.  Babies often swallow air during feeding. This can make babies fussy. Burping your baby between breasts can help with this.  Vitamin D supplements are recommended for babies who get only breast milk.  Avoid using a pacifier during your baby's first 4 6 weeks.  Avoid supplemental feedings of water, formula, or   juice in place of breastfeeding. Breast milk is all the food your baby needs. It is not necessary for your baby to have water or formula. Your breasts will make more milk if supplemental feedings are avoided during the early weeks. HOW TO TELL WHETHER YOUR BABY IS GETTING ENOUGH BREAST  MILK Wondering whether or not your baby is getting enough milk is a common concern among mothers. You can be assured that your baby is getting enough milk if:   Your baby is actively sucking and you hear swallowing.   Your baby seems relaxed and satisfied after a feeding.   Your baby nurses at least 8 12 times in a 24 hour time period.  During the first 3 5 days of age:  Your baby is wetting at least 3 5 diapers in a 24 hour period. The urine should be clear and pale yellow.  Your baby is having at least 3 4 stools in a 24 hour period. The stool should be soft and yellow.  At 5 7 days of age, your baby is having at least 3 6 stools in a 24 hour period. The stool should be seedy and yellow by 5 days of age.  Your baby has a weight loss less than 7 10% during the first 3 days of age.  Your baby does not lose weight after 3 7 days of age.  Your baby gains 4 7 ounces each week after he or she is 4 days of age.  Your baby gains weight by 5 days of age and is back to birth weight within 2 weeks. ENGORGEMENT In the first week after your baby is born, you may experience extremely full breasts (engorgement). When engorged, your breasts may feel heavy, warm, or tender to the touch. Engorgement peaks within 24 48 hours after delivery of your baby.  Engorgement may be reduced by:  Continuing to breastfeed.  Increasing the frequency of breastfeeding.  Taking warm showers or applying warm, moist heat to your breasts just before each feeding. This increases circulation and helps the milk flow.   Gently massaging your breast before and during the feedings. With your fingertips, massage from your chest wall towards your nipple in a circular motion.   Ensuring that your baby empties at least one breast at every feeding. It also helps to start the next feeding on the opposite breast.   Expressing breast milk by hand or by using a breast pump to empty the breasts if your baby is sleepy, or  not nursing well. You may also want to express milk if you are returning to work oryou feel you are getting engorged.  Ensuring your baby is latched on and positioned properly while breastfeeding. If you follow these suggestions, your engorgement should improve in 24 48 hours. If you are still experiencing difficulty, call your lactation consultant or caregiver.  CARING FOR YOURSELF Take care of your breasts.  Bathe or shower daily.   Avoid using soap on your nipples.   Wear a supportive bra. Avoid wearing underwire style bras.  Air dry your nipples for a 3 4minutes after each feeding.   Use only cotton bra pads to absorb breast milk leakage. Leaking of breast milk between feedings is normal.   Use only pure lanolin on your nipples after nursing. You do not need to wash it off before feeding your baby again. Another option is to express a few drops of breast milk and gently massage that milk into your nipples.  Continue   breast self-awareness checks. Take care of yourself.  Eat healthy foods. Alternate 3 meals with 3 snacks.  Avoid foods that you notice affect your baby in a bad way.  Drink milk, fruit juice, and water to satisfy your thirst (about 8 glasses a day).   Rest often, relax, and take your prenatal vitamins to prevent fatigue, stress, and anemia.  Avoid chewing and smoking tobacco.  Avoid alcohol and drug use.  Take over-the-counter and prescribed medicine only as directed by your caregiver or pharmacist. You should always check with your caregiver or pharmacist before taking any new medicine, vitamin, or herbal supplement.  Know that pregnancy is possible while breastfeeding. If desired, talk to your caregiver about family planning and safe birth control methods that may be used while breastfeeding. SEEK MEDICAL CARE IF:   You feel like you want to stop breastfeeding or have become frustrated with breastfeeding.  You have painful breasts or nipples.  Your  nipples are cracked or bleeding.  Your breasts are red, tender, or warm.  You have a swollen area on either breast.  You have a fever or chills.  You have nausea or vomiting.  You have drainage from your nipples.  Your breasts do not become full before feedings by the 5th day after delivery.  You feel sad and depressed.  Your baby is too sleepy to eat well.  Your baby is having trouble sleeping.   Your baby is wetting less than 3 diapers in a 24 hour period.  Your baby has less than 3 stools in a 24 hour period.  Your baby's skin or the white part of his or her eyes becomes more yellow.   Your baby is not gaining weight by 5 days of age. MAKE SURE YOU:   Understand these instructions.  Will watch your condition.  Will get help right away if you are not doing well or get worse. Document Released: 07/24/2005 Document Revised: 04/17/2012 Document Reviewed: 02/28/2012 [employer] Patient Information 2014 [employer].  

## 2013-03-31 NOTE — Progress Notes (Signed)
Pulse 87 Edema trace in ankles. C/o constipation.

## 2013-03-31 NOTE — Progress Notes (Signed)
Ultrasound scheduled for 04/01/13 at 3:30 pm.

## 2013-04-01 ENCOUNTER — Ambulatory Visit (HOSPITAL_COMMUNITY)
Admission: RE | Admit: 2013-04-01 | Discharge: 2013-04-01 | Disposition: A | Discharge status: Home / Self Care | Payer: Medicaid Other | Source: Ambulatory Visit | Attending: Family Medicine | Admitting: Family Medicine

## 2013-04-01 ENCOUNTER — Encounter: Payer: Self-pay | Admitting: Family Medicine

## 2013-04-01 DIAGNOSIS — O99340 Other mental disorders complicating pregnancy, unspecified trimester: Secondary | ICD-10-CM | POA: Insufficient documentation

## 2013-04-01 DIAGNOSIS — O09899 Supervision of other high risk pregnancies, unspecified trimester: Secondary | ICD-10-CM | POA: Insufficient documentation

## 2013-04-01 DIAGNOSIS — Z3689 Encounter for other specified antenatal screening: Secondary | ICD-10-CM | POA: Insufficient documentation

## 2013-04-01 DIAGNOSIS — F1910 Other psychoactive substance abuse, uncomplicated: Secondary | ICD-10-CM | POA: Insufficient documentation

## 2013-04-01 DIAGNOSIS — O99322 Drug use complicating pregnancy, second trimester: Secondary | ICD-10-CM

## 2013-04-01 LAB — OBSTETRIC PANEL
Antibody Screen: NEGATIVE
Basophils Relative: 0 % (ref 0–1)
Eosinophils Absolute: 0.1 10*3/uL (ref 0.0–0.7)
Hemoglobin: 11.1 g/dL — ABNORMAL LOW (ref 12.0–15.0)
MCH: 29.8 pg (ref 26.0–34.0)
MCHC: 34.2 g/dL (ref 30.0–36.0)
Monocytes Absolute: 0.6 10*3/uL (ref 0.1–1.0)
Monocytes Relative: 6 % (ref 3–12)
Neutrophils Relative %: 70 % (ref 43–77)
Rh Type: POSITIVE

## 2013-04-01 LAB — TSH: TSH: 3.729 u[IU]/mL (ref 0.350–4.500)

## 2013-04-01 LAB — CULTURE, OB URINE

## 2013-04-01 LAB — GLUCOSE TOLERANCE, 1 HOUR (50G) W/O FASTING: Glucose, 1 Hour GTT: 100 mg/dL (ref 70–140)

## 2013-04-03 ENCOUNTER — Encounter: Payer: Self-pay | Admitting: Family Medicine

## 2013-04-28 ENCOUNTER — Ambulatory Visit (INDEPENDENT_AMBULATORY_CARE_PROVIDER_SITE_OTHER): Payer: Medicaid Other | Admitting: Obstetrics & Gynecology

## 2013-04-28 VITALS — BP 120/67 | Wt 259.5 lb

## 2013-04-28 DIAGNOSIS — O99333 Smoking (tobacco) complicating pregnancy, third trimester: Secondary | ICD-10-CM

## 2013-04-28 DIAGNOSIS — Z1389 Encounter for screening for other disorder: Secondary | ICD-10-CM

## 2013-04-28 DIAGNOSIS — J069 Acute upper respiratory infection, unspecified: Secondary | ICD-10-CM

## 2013-04-28 DIAGNOSIS — F1920 Other psychoactive substance dependence, uncomplicated: Secondary | ICD-10-CM

## 2013-04-28 DIAGNOSIS — O99322 Drug use complicating pregnancy, second trimester: Secondary | ICD-10-CM

## 2013-04-28 DIAGNOSIS — Z0489 Encounter for examination and observation for other specified reasons: Secondary | ICD-10-CM

## 2013-04-28 DIAGNOSIS — O99330 Smoking (tobacco) complicating pregnancy, unspecified trimester: Secondary | ICD-10-CM

## 2013-04-28 LAB — POCT URINALYSIS DIP (DEVICE)
Glucose, UA: NEGATIVE mg/dL
Nitrite: NEGATIVE
Urobilinogen, UA: 0.2 mg/dL (ref 0.0–1.0)

## 2013-04-28 MED ORDER — BENZONATATE 200 MG PO CAPS: 200.0000 mg | ORAL_CAPSULE | Freq: Three times a day (TID) | ORAL | Status: DC | PRN

## 2013-04-28 NOTE — Progress Notes (Signed)
U/S scheduled 05/01/13 at 9 15 am.

## 2013-04-28 NOTE — Progress Notes (Signed)
Already has refills on Albuterol, Tessalon prescribed for cough.  Third trimester labs, Tdap next visit.  Declines flu vaccine.  Follow up scan ordered.  No other complaints or concerns.  Fetal movement and labor precautions reviewed.

## 2013-04-28 NOTE — Progress Notes (Signed)
Pulse: 79 Has a cold. Not feeling well. Needs a refill on her inhaler. Has a cough.

## 2013-04-28 NOTE — Patient Instructions (Addendum)
Return to clinic for any obstetric concerns or go to MAU for evaluation Tetanus, Diphtheria (Td) or Tetanus, Diphtheria, Pertussis (Tdap) Vaccine What You Need to Know WHY GET VACCINATED? Tetanus, diphtheria and pertussis can be very serious diseases. TETANUS (Lockjaw) causes painful muscle spasms and stiffness, usually all over the body.  Tetanus can lead to tightening of muscles in the head and neck so the victim cannot open his mouth or swallow, or sometimes even breathe. Tetanus kills about 1 out of 5 people who are infected. DIPHTHERIA can cause a thick membrane to cover the back of the throat.  Diphtheria can lead to breathing problems, paralysis, heart failure, and even death. PERTUSSIS (Whooping Cough) causes severe coughing spells which can lead to difficulty breathing, vomiting, and disturbed sleep.  Pertussis can lead to weight loss, incontinence, rib fractures and passing out from violent coughing. Up to 2 in 100 adolescents and 5 in 100 adults with pertussis are hospitalized or have complications, including pneumonia and death. These 3 diseases are all caused by bacteria. Diphtheria and pertussis are spread from person to person. Tetanus enters the body through cuts, scratches, or wounds. The [place] saw as many as 200,000 cases a year of diphtheria and pertussis before vaccines were available, and hundreds of cases of tetanus. Since then, tetanus and diphtheria cases have dropped by about 99% and pertussis cases by about 92%. Children 6 years of age and younger get DTaP vaccine to protect them from these three diseases. But older children, adolescents, and adults need protection too. VACCINES FOR ADOLESCENTS AND ADULTS: TD AND TDAP Two vaccines are available to protect people 7 years of age and older from these diseases:  Td vaccine has been used for many years. It protects against tetanus and diphtheria.  Tdap vaccine was licensed in 2005. It is the first vaccine for  adolescents and adults that protects against pertussis as well as tetanus and diphtheria. A Td booster dose is recommended every 10 years. Tdap is given only once. WHICH VACCINE, AND WHEN? Ages 7 through 18 years  A dose of Tdap is recommended at age 11 or 12. This dose could be given as early as age 7 for children who missed one or more childhood doses of DTaP.  Children and adolescents who did not get a complete series of DTaP shots by age 7 should complete the series using a combination of Td and Tdap. Age 19 years and Older  All adults should get a booster dose of Td every 10 years. Adults under 65 who have never gotten Tdap should get a dose of Tdap as their next booster dose. Adults 65 and older may get one booster dose of Tdap.  Adults (including women who may become pregnant and adults 65 and older) who expect to have close contact with a baby younger than 12 months of age should get a dose of Tdap to help protect the baby from pertussis.  Healthcare professionals who have direct patient contact in hospitals or clinics should get one dose of Tdap. Protection After a Wound  A person who gets a severe cut or burn might need a dose of Td or Tdap to prevent tetanus infection. Tdap should be used for anyone who has never had a dose previously. Td should be used if Tdap is not available, or for:  Anybody who has already had a dose of Tdap.  Children 7 through 9 years of age who completed the childhood DTaP series.  Adults 65 and older.   Pregnant Women  Pregnant women who have never had a dose of Tdap should get one, after the 20th week of gestation and preferably during the 3rd trimester. If they do not get Tdap during their pregnancy they should get a dose as soon as possible after delivery. Pregnant women who have previously received Tdap and need tetanus or diphtheria vaccine while pregnant should get Td. Tdap and Td may be given at the same time as other vaccines. SOME PEOPLE SHOULD  NOT BE VACCINATED OR SHOULD WAIT  Anyone who has had a life-threatening allergic reaction after a dose of any tetanus, diphtheria, or pertussis containing vaccine should not get Td or Tdap.  Anyone who has a severe allergy to any component of a vaccine should not get that vaccine. Tell your doctor if the person getting the vaccine has any severe allergies.  Anyone who had a coma, or long or multiple seizures within 7 days after a dose of DTP or DTaP should not get Tdap, unless a cause other than the vaccine was found. These people may get Td.  Talk to your doctor if the person getting either vaccine:  Has epilepsy or another nervous system problem.  Had severe swelling or severe pain after a previous dose of DTP, DTaP, DT, Td, or Tdap vaccine.  Has had Guillain Barr Syndrome (GBS). Anyone who has a moderate or severe illness on the day the shot is scheduled should usually wait until they recover before getting Tdap or Td vaccine. A person with a mild illness or low fever can usually be vaccinated. WHAT ARE THE RISKS FROM TDAP AND TD VACCINES? With a vaccine, as with any medicine, there is always a small risk of a life-threatening allergic reaction or other serious problem. Brief fainting spells and related symptoms (such as jerking movements) can happen after any medical procedure, including vaccination. Sitting or lying down for about 15 minutes after a vaccination can help prevent fainting and injuries caused by falls. Tell your doctor if the patient feels dizzy or lightheaded, or has vision changes or ringing in the ears. Getting tetanus, diphtheria, or pertussis would be much more likely to lead to severe problems than getting either Td or Tdap vaccine. Problems reported after Td and Tdap vaccines are listed below. Mild Problems (noticeable, but did not interfere with activities) Tdap  Pain (about 3 in 4 adolescents and 2 in 3 adults).  Redness or swelling (about 1 in 5).  Mild fever  of at least 100.4 F (38 C) (up to about 1 in 25 adolescents and 1 in 100 adults).  Headache (about 4 in 10 adolescents and 3 in 10 adults).  Tiredness (about 1 in 3 adolescents and 1 in 4 adults).  Nausea, vomiting, diarrhea, or stomach ache (up to 1 in 4 adolescents and 1 in 10 adults).  Chills, body aches, sore joints, rash, or swollen glands (uncommon). Td  Pain (up to about 8 in 10).  Redness or swelling at the injection site (up to about 1 in 3).  Mild fever (up to about 1 in 15).  Headache or tiredness (uncommon). Moderate Problems (interfered with activities, but did not require medical attention) Tdap  Pain at the injection site (about 1 in 20 adolescents and 1 in 100 adults).  Redness or swelling at the injection site (up to about 1 in 16 adolescents and 1 in 25 adults).  Fever over 102 F (38.9 C) (about 1 in 100 adolescents and 1 in 250 adults).  Headache (  1 in 300).  Nausea, vomiting, diarrhea, or stomach ache (up to 3 in 100 adolescents and 1 in 100 adults). Td  Fever over 102 F (38.9 C) (rare). Tdap or Td  Extensive swelling of the arm where the shot was given (up to about 3 in 100). Severe Problems (Unable to perform usual activities; required medical attention) Tdap or Td  Swelling, severe pain, bleeding, and redness in the arm where the shot was given (rare). A severe allergic reaction could occur after any vaccine. They are estimated to occur less than once in a million doses. WHAT IF THERE IS A SEVERE REACTION? What should I look for? Any unusual condition, such as a severe allergic reaction or a high fever. If a severe allergic reaction occurred, it would be within a few minutes to an hour after the shot. Signs of a serious allergic reaction can include difficulty breathing, weakness, hoarseness or wheezing, a fast heartbeat, hives, dizziness, paleness, or swelling of the throat. What should I do?  Call a doctor, or get the person to a doctor  right away.  Tell your doctor what happened, the date and time it happened, and when the vaccination was given.  Ask your provider to report the reaction by filing a Vaccine Adverse Event Reporting System (VAERS) form. Or, you can file this report through the VAERS website at www.vaers.hhs.gov or by calling 1-800-822-7967. VAERS does not provide medical advice. THE NATIONAL VACCINE INJURY COMPENSATION PROGRAM The National Vaccine Injury Compensation Program (VICP) was created in 1986. Persons who believe they may have been injured by a vaccine can learn about the program and about filing a claim by calling 1-800-338-2382 or visiting the VICP website at www.hrsa.gov/vaccinecompensation. HOW CAN I LEARN MORE?  Your doctor can give you the vaccine package insert or suggest other sources of information.  Call your local or state health department.  Contact the Centers for Disease Control and Prevention (CDC):  Call 1-800-232-4636 (1-800-CDC-INFO).  Visit the CDC website at www.cdc.gov/vaccines. CDC Td and Tdap Interim VIS (08/30/10) Document Released: 05/21/2006 Document Revised: 10/16/2011 Document Reviewed: 08/30/2010 Exit[employer] Patient Information 2014 [employer].  

## 2013-05-01 ENCOUNTER — Ambulatory Visit (HOSPITAL_COMMUNITY): Admission: RE | Admit: 2013-05-01 | Payer: Medicaid Other | Source: Ambulatory Visit

## 2013-05-12 ENCOUNTER — Encounter: Payer: Medicaid Other | Admitting: Advanced Practice Midwife

## 2013-05-12 ENCOUNTER — Encounter: Payer: Self-pay | Admitting: Advanced Practice Midwife

## 2013-05-19 ENCOUNTER — Inpatient Hospital Stay (HOSPITAL_COMMUNITY)
Admission: AD | Admit: 2013-05-19 | Discharge: 2013-05-19 | Disposition: A | Discharge status: Home / Self Care | Payer: Medicaid Other | Source: Ambulatory Visit | Attending: Obstetrics & Gynecology | Admitting: Obstetrics & Gynecology

## 2013-05-19 ENCOUNTER — Encounter (HOSPITAL_COMMUNITY): Payer: Self-pay | Admitting: *Deleted

## 2013-05-19 DIAGNOSIS — O99322 Drug use complicating pregnancy, second trimester: Secondary | ICD-10-CM

## 2013-05-19 DIAGNOSIS — O99212 Obesity complicating pregnancy, second trimester: Secondary | ICD-10-CM

## 2013-05-19 DIAGNOSIS — K5900 Constipation, unspecified: Secondary | ICD-10-CM

## 2013-05-19 DIAGNOSIS — O212 Late vomiting of pregnancy: Secondary | ICD-10-CM | POA: Insufficient documentation

## 2013-05-19 DIAGNOSIS — R1084 Generalized abdominal pain: Secondary | ICD-10-CM

## 2013-05-19 DIAGNOSIS — O99891 Other specified diseases and conditions complicating pregnancy: Secondary | ICD-10-CM | POA: Insufficient documentation

## 2013-05-19 DIAGNOSIS — O0990 Supervision of high risk pregnancy, unspecified, unspecified trimester: Secondary | ICD-10-CM

## 2013-05-19 DIAGNOSIS — O99330 Smoking (tobacco) complicating pregnancy, unspecified trimester: Secondary | ICD-10-CM

## 2013-05-19 DIAGNOSIS — R109 Unspecified abdominal pain: Secondary | ICD-10-CM | POA: Insufficient documentation

## 2013-05-19 DIAGNOSIS — R112 Nausea with vomiting, unspecified: Secondary | ICD-10-CM

## 2013-05-19 LAB — URINALYSIS, ROUTINE W REFLEX MICROSCOPIC
Hgb urine dipstick: NEGATIVE
Leukocytes, UA: NEGATIVE
Protein, ur: NEGATIVE mg/dL
Urobilinogen, UA: 0.2 mg/dL (ref 0.0–1.0)

## 2013-05-19 LAB — RAPID URINE DRUG SCREEN, HOSP PERFORMED
Opiates: NOT DETECTED
Tetrahydrocannabinol: NOT DETECTED

## 2013-05-19 MED ORDER — LACTATED RINGERS IV BOLUS (SEPSIS)
1000.0000 mL | Freq: Once | INTRAVENOUS | Status: AC
  Administered 2013-05-19: 1000 mL via INTRAVENOUS

## 2013-05-19 MED ORDER — LACTATED RINGERS IV BOLUS (SEPSIS): 1000.0000 mL | Freq: Once | INTRAVENOUS | Status: DC

## 2013-05-19 MED ORDER — POLYETHYLENE GLYCOL 3350 17 GM/SCOOP PO POWD: 17.0000 g | Freq: Every day | ORAL | Status: DC

## 2013-05-19 MED ORDER — MILK AND MOLASSES ENEMA: Freq: Once | RECTAL | Status: DC

## 2013-05-19 MED ORDER — SODIUM CHLORIDE 0.9 % IV BOLUS (SEPSIS)
1000.0000 mL | Freq: Once | INTRAVENOUS | Status: AC
  Administered 2013-05-19: 1000 mL via INTRAVENOUS

## 2013-05-19 NOTE — MAU Note (Signed)
Pt reports no BM in over one week. Ducolax taken  @ 2200 then pain, n/v began after.

## 2013-05-19 NOTE — MAU Note (Signed)
Pt reports IV to be bleeding. Upon assessment, IV catheter half way out of pt.'s hand. IV Dc'd. Dr. Reola Calkins made aware.

## 2013-05-19 NOTE — MAU Note (Addendum)
Pt manually disimpacted herself then able to have a BM. Atempted to obtain urine sample when pt in bathroom, but pt voided in toilet instead.Bedside commode @ bedside. Pt able to have another BM. Cramping and vomiting persist. Soaps suds enema given as ordered, about 500cc infused per rectum. Pt only able to hold enema for about one minute. Pt unable to give urine sample and refusing cath UA.

## 2013-05-19 NOTE — MAU Provider Note (Signed)
Chief Complaint:  Abdominal pain, nausea, vomiting.   Lydian Chavous is a 32 y.o.  [id number] with IUP at [redacted]w[redacted]d presenting for abdominal pain, nausea and vomiting. .  Pt states that she was feeling fine until around 6 pm tonight when she took some dulcolax for chronic constipation. She hadn't had a bowel movement in 2 weeks. Soon after the dulcolax, she began to have crampy abdominal pain and threw up several times.  Her husband got concerned and called EMS to bring her here for evaluation.  States her abdominal pain is improved but still having pain in rectum and feeling like she needs to stool but can't.  Has had to manually disempact herself in the past.   Pt is seen in [facility] although missed her last appointment.  She is on subutex for  Hx of codeine abuse. Her pregnancy thus far has been uncomplicated.  Her anatomy US was done but limited and she did not make her repeat appointment.   Menstrual History: OB History   Grav Para Term Preterm Abortions TAB SAB Ect Mult Living   5 2 2  2  2   2     G1- Term SVD G2- Term SVD G3- SAB G4- SAB    Patient's last menstrual period was 10/19/2012.      Past Medical History  Diagnosis Date  . Hypothyroidism     no meds  . Abnormal Pap smear   . Asthma   . History of drug abuse     codiene  . Miscarriage     Past Surgical History  Procedure Laterality Date  . Cholecystectomy    . Myringostomy Bilateral     Family History  Problem Relation Age of Onset  . Other Neg Hx   . Cancer Mother     lung  . Diabetes Mother   . Diabetes Maternal Aunt   . Diabetes Maternal Grandmother   . Cancer Maternal Grandfather     lung  . Diabetes Paternal Grandmother   . Diabetes Paternal Grandfather     History  Substance Use Topics  . Smoking status: Current Every Day Smoker -- 0.50 packs/day    Types: Cigarettes  . Smokeless tobacco: Never Used  . Alcohol Use: No      Allergies  Allergen Reactions  . Other Nausea And Vomiting   Allergic to anesthesia. Makes patient "violently nauseous"    Prescriptions prior to admission  Medication Sig Dispense Refill  . albuterol (PROVENTIL HFA;VENTOLIN HFA) 108 (90 BASE) MCG/ACT inhaler Inhale 2 puffs into the lungs every 6 (six) hours as needed (asthma).  18 g  3  . ondansetron (ZOFRAN ODT) 4 MG disintegrating tablet Take 1 tablet (4 mg total) by mouth every 6 (six) hours as needed for nausea.  42 tablet  3  . promethazine (PHENERGAN) 25 MG tablet Take 1 tablet (25 mg total) by mouth every 6 (six) hours as needed for nausea.  30 tablet  0  . benzonatate (TESSALON) 200 MG capsule Take 1 capsule (200 mg total) by mouth 3 (three) times daily as needed for cough.  30 capsule  2  . buprenorphine (SUBUTEX) 8 MG SUBL Place 8 mg under the tongue 3 (three) times daily as needed (pain).       Marland Kitchen omeprazole (PRILOSEC OTC) 20 MG tablet Take 1 tablet (20 mg total) by mouth daily.  30 tablet  3  . Prenatal Vit-Fe Fumarate-FA (PRENATAL COMPLETE) 14-0.4 MG TABS Take 1 tablet by mouth daily.  60  each  2    Review of Systems - Negative except for what is mentioned in HPI.  Physical Exam  Blood pressure 112/56, pulse 48, temperature 97.7 F (36.5 C), temperature source Oral, resp. rate 18, last menstrual period 10/19/2012. GENERAL: Well-developed, obese female in no acute distress. Sleepy but arousable. Falling asleep with questioning.    LUNGS: Clear to auscultation bilaterally.  HEART: Regular rate and rhythm. ABDOMEN: Soft, nontender, obese, nondistended, gravid.  EXTREMITIES: Nontender, no edema, 2+ distal pulses. Cervical Exam: Dilatation 0cm   Effacement 0%   Station high. Significant palpable hard stool in the rectal vault.    FHT:  Baseline rate 120 bpm   Variability moderate  - unable to leave on monitor for 20 min, due to pt uncooperation.  Contractions: quiet   Labs: Results for orders placed during the hospital encounter of 05/19/13 (from the past 24 hour(s))  URINALYSIS, ROUTINE  W REFLEX MICROSCOPIC   Collection Time    05/19/13  5:25 AM      Result Value Range   Color, Urine YELLOW  YELLOW   APPearance CLEAR  CLEAR   Specific Gravity, Urine >1.030 (*) 1.005 - 1.030   pH 5.5  5.0 - 8.0   Glucose, UA NEGATIVE  NEGATIVE mg/dL   Hgb urine dipstick NEGATIVE  NEGATIVE   Bilirubin Urine SMALL (*) NEGATIVE   Ketones, ur >80 (*) NEGATIVE mg/dL   Protein, ur NEGATIVE  NEGATIVE mg/dL   Urobilinogen, UA 0.2  0.0 - 1.0 mg/dL   Nitrite NEGATIVE  NEGATIVE   Leukocytes, UA NEGATIVE  NEGATIVE    Imaging Studies:  No results found.  Assessment: Izabella Marcantel is  32 y.o. [phone] at [redacted]w[redacted]d presents with No chief complaint on file. .  Plan:  1) abdominal cramping, nausea, vomiting - suspect related to severe constipation and worsened by recent use of dulcolex in the setting of impaction.  - given soap suds enema here with improvement in symptoms.  - pain down to a 2/10 - still having loose stools - one episode of vomiting - UA consistent with dehydration--> given 1L of NS and attempted 2nd but pt pulled out IV - requesting to go home and do these things in her own bathroom as markedly improved after enema.   2) hx of drug abuse - sleepy here and somewhat altered seeming affect - collected UDS - UDS not back at time of pt requesting to leave - consider more thorough UDS at next clinic visit  F/u as scheduled next week in clinic. Should her pain worsen, change, or she be unable to tolerate po, advised her to return.  FWB- monitored for 10 minutes and appropriate for gestational age but unable to monitor longer due to patient uncooperativeness.   Pt left without being officially discharged or receiving her paperwork.   Ashni Lonzo L 10/13/20146:31 AM

## 2013-06-02 ENCOUNTER — Ambulatory Visit (HOSPITAL_COMMUNITY)
Admission: RE | Admit: 2013-06-02 | Discharge: 2013-06-02 | Disposition: A | Discharge status: Home / Self Care | Payer: Medicaid Other | Source: Ambulatory Visit | Attending: Obstetrics & Gynecology | Admitting: Obstetrics & Gynecology

## 2013-06-02 ENCOUNTER — Ambulatory Visit (HOSPITAL_COMMUNITY): Payer: Medicaid Other

## 2013-06-02 ENCOUNTER — Other Ambulatory Visit: Payer: Self-pay | Admitting: Obstetrics & Gynecology

## 2013-06-02 ENCOUNTER — Encounter: Payer: Medicaid Other | Admitting: Obstetrics and Gynecology

## 2013-06-02 DIAGNOSIS — Z0489 Encounter for examination and observation for other specified reasons: Secondary | ICD-10-CM

## 2013-06-02 DIAGNOSIS — O99322 Drug use complicating pregnancy, second trimester: Secondary | ICD-10-CM

## 2013-06-02 DIAGNOSIS — O365990 Maternal care for other known or suspected poor fetal growth, unspecified trimester, not applicable or unspecified: Secondary | ICD-10-CM | POA: Insufficient documentation

## 2013-06-02 DIAGNOSIS — Z3689 Encounter for other specified antenatal screening: Secondary | ICD-10-CM | POA: Insufficient documentation

## 2013-06-02 DIAGNOSIS — E669 Obesity, unspecified: Secondary | ICD-10-CM | POA: Insufficient documentation

## 2013-06-23 ENCOUNTER — Encounter: Payer: Medicaid Other | Admitting: Obstetrics and Gynecology

## 2013-06-30 ENCOUNTER — Encounter: Payer: Medicaid Other | Admitting: Obstetrics & Gynecology

## 2013-06-30 ENCOUNTER — Ambulatory Visit (INDEPENDENT_AMBULATORY_CARE_PROVIDER_SITE_OTHER): Payer: Medicaid Other | Admitting: Obstetrics & Gynecology

## 2013-06-30 ENCOUNTER — Encounter: Payer: Self-pay | Admitting: *Deleted

## 2013-06-30 VITALS — BP 124/76 | Temp 96.8°F | Wt 265.7 lb

## 2013-06-30 DIAGNOSIS — E669 Obesity, unspecified: Secondary | ICD-10-CM

## 2013-06-30 DIAGNOSIS — O99333 Smoking (tobacco) complicating pregnancy, third trimester: Secondary | ICD-10-CM

## 2013-06-30 DIAGNOSIS — O99330 Smoking (tobacco) complicating pregnancy, unspecified trimester: Secondary | ICD-10-CM

## 2013-06-30 DIAGNOSIS — O99322 Drug use complicating pregnancy, second trimester: Secondary | ICD-10-CM

## 2013-06-30 DIAGNOSIS — O99212 Obesity complicating pregnancy, second trimester: Secondary | ICD-10-CM

## 2013-06-30 DIAGNOSIS — O0990 Supervision of high risk pregnancy, unspecified, unspecified trimester: Secondary | ICD-10-CM

## 2013-06-30 DIAGNOSIS — F1920 Other psychoactive substance dependence, uncomplicated: Secondary | ICD-10-CM

## 2013-06-30 LAB — CBC
HCT: 27.1 % — ABNORMAL LOW (ref 36.0–46.0)
Hemoglobin: 9.4 g/dL — ABNORMAL LOW (ref 12.0–15.0)
MCHC: 34.7 g/dL (ref 30.0–36.0)
MCV: 82.4 fL (ref 78.0–100.0)

## 2013-06-30 LAB — OB RESULTS CONSOLE GC/CHLAMYDIA
CHLAMYDIA, DNA PROBE: NEGATIVE
GC PROBE AMP, GENITAL: NEGATIVE

## 2013-06-30 LAB — GLUCOSE TOLERANCE, 1 HOUR (50G) W/O FASTING: Glucose, 1 Hour GTT: 148 mg/dL — ABNORMAL HIGH (ref 70–140)

## 2013-06-30 LAB — HIV ANTIBODY (ROUTINE TESTING W REFLEX): HIV: NONREACTIVE

## 2013-06-30 LAB — RPR

## 2013-06-30 NOTE — Progress Notes (Signed)
[place] clinic staff contacted, patient to have rest of prenatal care in [place].  Pelvic cultures today; also third trimester labs.  Counseled about Tdap, patient will consider this and decide if she wants this vaccination.  No other complaints or concerns.  Fetal movement and labor precautions reviewed. Considering BTL, papers signed 06/30/2013.

## 2013-06-30 NOTE — Progress Notes (Signed)
Pulse- 90  Contraction- braxton hicks  Edema-hands, ankles Pain/pressure-lower back, belly button stretching Pt lives in [place] would like to know if can transfer care there due to transportation issues

## 2013-06-30 NOTE — Patient Instructions (Signed)

## 2013-07-01 ENCOUNTER — Encounter: Payer: Self-pay | Admitting: *Deleted

## 2013-07-01 ENCOUNTER — Telehealth: Payer: Self-pay | Admitting: *Deleted

## 2013-07-01 ENCOUNTER — Encounter: Payer: Self-pay | Admitting: Obstetrics & Gynecology

## 2013-07-01 LAB — GC/CHLAMYDIA PROBE AMP: GC Probe RNA: NEGATIVE

## 2013-07-01 LAB — POCT URINALYSIS DIP (DEVICE)
Glucose, UA: NEGATIVE mg/dL
Hgb urine dipstick: NEGATIVE
Nitrite: NEGATIVE

## 2013-07-01 NOTE — Telephone Encounter (Signed)
Message copied by Arne Cleveland on Tue Jul 01, 2013 10:23 AM ------      Message from: Jaynie Collins A      Created: Tue Jul 01, 2013  8:44 AM       1 hr GTT 148.  Please schedule for 3 hr GTT (how do you do this for patients in [place])?  Patient will not come to [facility] for this, has transportation issues.  Also encourage patient to take oral iron supplement: Ferrous Sulfate 325 mg po bid can do, can get over the counter. ------

## 2013-07-01 NOTE — Telephone Encounter (Signed)
Called pt to set up appt in [place] office and to go over recent lab results - Pt has a full voicemail and I was unable to leave a message - I contacted her father, [name] [name] at [phone] and he said he will have pt call me back

## 2013-07-02 ENCOUNTER — Encounter: Payer: Medicaid Other | Admitting: Obstetrics & Gynecology

## 2013-07-02 ENCOUNTER — Encounter: Payer: Self-pay | Admitting: Obstetrics & Gynecology

## 2013-07-02 LAB — CULTURE, BETA STREP (GROUP B ONLY)

## 2013-08-04 ENCOUNTER — Encounter (HOSPITAL_COMMUNITY): Payer: Self-pay | Admitting: *Deleted

## 2013-08-04 ENCOUNTER — Inpatient Hospital Stay (EMERGENCY_DEPARTMENT_HOSPITAL)
Admission: AD | Admit: 2013-08-04 | Discharge: 2013-08-04 | Disposition: A | Discharge status: Home / Self Care | Payer: Medicaid Other | Source: Ambulatory Visit | Attending: Obstetrics & Gynecology | Admitting: Obstetrics & Gynecology

## 2013-08-04 DIAGNOSIS — O0990 Supervision of high risk pregnancy, unspecified, unspecified trimester: Secondary | ICD-10-CM

## 2013-08-04 DIAGNOSIS — O99212 Obesity complicating pregnancy, second trimester: Secondary | ICD-10-CM

## 2013-08-04 DIAGNOSIS — O1203 Gestational edema, third trimester: Secondary | ICD-10-CM

## 2013-08-04 DIAGNOSIS — IMO0002 Reserved for concepts with insufficient information to code with codable children: Secondary | ICD-10-CM

## 2013-08-04 DIAGNOSIS — O99322 Drug use complicating pregnancy, second trimester: Secondary | ICD-10-CM

## 2013-08-04 DIAGNOSIS — O99333 Smoking (tobacco) complicating pregnancy, third trimester: Secondary | ICD-10-CM

## 2013-08-04 HISTORY — DX: Other intervertebral disc displacement, lumbar region: M51.26

## 2013-08-04 HISTORY — DX: Obesity, unspecified: E66.9

## 2013-08-04 LAB — COMPREHENSIVE METABOLIC PANEL
ALT: 7 U/L (ref 0–35)
Albumin: 2.3 g/dL — ABNORMAL LOW (ref 3.5–5.2)
Alkaline Phosphatase: 115 U/L (ref 39–117)
CO2: 22 mEq/L (ref 19–32)
GFR calc Af Amer: >90 mL/min (ref >90)
GFR calc non Af Amer: >90 mL/min (ref >90)
Glucose, Bld: 97 mg/dL (ref 70–99)
Potassium: 3.3 mEq/L — ABNORMAL LOW (ref 3.5–5.1)
Sodium: 135 mEq/L (ref 135–145)
Total Bilirubin: 0.2 mg/dL — ABNORMAL LOW (ref 0.3–1.2)

## 2013-08-04 LAB — CBC
Hemoglobin: 8.9 g/dL — ABNORMAL LOW (ref 12.0–15.0)
MCH: 26.2 pg (ref 26.0–34.0)
RBC: 3.4 MIL/uL — ABNORMAL LOW (ref 3.87–5.11)
RDW: 14.4 % (ref 11.5–15.5)
WBC: 8.5 10*3/uL (ref 4.0–10.5)

## 2013-08-04 LAB — PROTEIN / CREATININE RATIO, URINE: Total Protein, Urine: 15.3 mg/dL

## 2013-08-04 NOTE — MAU Provider Note (Signed)
History     CSN: [id number]  Arrival date and time: 08/04/13 1607   None     Chief Complaint  Patient presents with  . Leg Swelling  . visual changes   . Headache   HPI [name] [name] is a 32 y.o. [id number] at [redacted]w[redacted]d by L=16.  Here for evaluation of swelling of lower extremities. Started over 1 month ago, but significantly worsened lower extremity 3 days ago. Occasional headaches with scotoma and dizziness. Improved with rest. Pt has had occasional ctx, normal fetal movement. No lof no vb.  OB History   Grav Para Term Preterm Abortions TAB SAB Ect Mult Living   5 2 2  2  2   2       Past Medical History  Diagnosis Date  . Hypothyroidism     no meds  . Abnormal Pap smear   . Asthma   . History of drug abuse     codiene  . Miscarriage   . Herniated lumbar intervertebral disc   . Obesity     Past Surgical History  Procedure Laterality Date  . Cholecystectomy    . Myringostomy Bilateral     Family History  Problem Relation Age of Onset  . Other Neg Hx   . Cancer Mother     lung  . Diabetes Mother   . Diabetes Maternal Aunt   . Diabetes Maternal Grandmother   . Cancer Maternal Grandfather     lung  . Diabetes Paternal Grandmother   . Diabetes Paternal Grandfather     History  Substance Use Topics  . Smoking status: Current Every Day Smoker -- 0.50 packs/day    Types: Cigarettes  . Smokeless tobacco: Never Used  . Alcohol Use: No    Allergies:  Allergies  Allergen Reactions  . Other Nausea And Vomiting    Allergic to anesthesia. Makes patient "violently nauseous"    No prescriptions prior to admission    ROS No f/c, no cp, no sob, no n/v, no urinary symptoms. No constipation.  Physical Exam   Blood pressure 117/64, pulse 88, temperature 98.5 F (36.9 C), temperature source Oral, resp. rate 18, height 5\' 9"  (1.753 m), weight 125.828 kg (277 lb 6.4 oz), last menstrual period 10/19/2012, SpO2 100.00%. Filed Vitals:   08/04/13 1705 08/04/13 1713  08/04/13 1746 08/04/13 1841  BP: 114/68 101/63 120/73 117/64  Pulse: 95  81 88  Temp:      TempSrc:      Resp:    18  Height:      Weight:      SpO2:         Physical Exam VSS, NAD Gravid NTTP ND  3+ edema bilaterally equal. Minimal ant tenderness to palpation to knee, 2+ pulses.  FHT 120s mod var, mult accel no decel Toco: no ctx  CBC    Component Value Date/Time   WBC 8.5 08/04/2013 1740   RBC 3.40* 08/04/2013 1740   HGB 8.9* 08/04/2013 1740   HCT 27.5* 08/04/2013 1740   PLT 211 08/04/2013 1740   MCV 80.9 08/04/2013 1740   MCH 26.2 08/04/2013 1740   MCHC 32.4 08/04/2013 1740   RDW 14.4 08/04/2013 1740   LYMPHSABS 2.4 03/31/2013 0954   MONOABS 0.6 03/31/2013 0954   EOSABS 0.1 03/31/2013 0954   BASOSABS 0.0 03/31/2013 0954    CMP  CMP     Component Value Date/Time   NA 135 08/04/2013 1740   K 3.3* 08/04/2013 1740   CL  103 08/04/2013 1740   CO2 22 08/04/2013 1740   GLUCOSE 97 08/04/2013 1740   BUN 9 08/04/2013 1740   CREATININE 0.63 08/04/2013 1740   CALCIUM 9.0 08/04/2013 1740   PROT 6.1 08/04/2013 1740   ALBUMIN 2.3* 08/04/2013 1740   AST 12 08/04/2013 1740   ALT 7 08/04/2013 1740   ALKPHOS 115 08/04/2013 1740   BILITOT 0.2* 08/04/2013 1740   GFRNONAA >90 08/04/2013 1740   GFRAA >90 08/04/2013 1740    Pro: cr Pending    MAU Course  Procedures  MDM reassurance  Assessment and Plan  [name] [name] is a 32 y.o. [id number] at [redacted]w[redacted]d presents with swelling, with stable labs. Fetus is reassuring and reactive. BPs WNL and Labs reassuring for unlikely preeclampsia. Encourage to keep feet raised, minimize prolonged standing. Will discharge home with scheduled induction for 09 Aug 2012 @ 0700. Pt given labor and Preeclampsia return precautions  [name] [name] RYAN 08/04/2013, 8:51 PM

## 2013-08-04 NOTE — MAU Note (Addendum)
15# increase since last wk, swelling knees down, hands numb.  Seeing spots.  Headache.  BP has been slightly elevated.  Got dizzy and fell over a couple hrs ago

## 2013-08-05 ENCOUNTER — Telehealth (HOSPITAL_COMMUNITY): Payer: Self-pay | Admitting: *Deleted

## 2013-08-05 NOTE — Telephone Encounter (Signed)
Preadmission screen  

## 2013-08-07 NOTE — L&D Delivery Note (Signed)
Delivery Note At 8:12 AM a viable female was delivered via Vaginal, Spontaneous Delivery (Presentation:frank breech ;  ).  APGAR: 8, 9; weight 6 lb 15.3 oz (3155 g).   Placenta status:spont via shults. Intact, Spontaneous.  Cord: 3 vessels with the following complications: None.  Cord pH: n/a  Anesthesia: Epidural  Episiotomy: None Lacerations: None Suture Repair: n/a Est. Blood Loss (mL): 300  Mom to postpartum.  Baby to Couplet care / Skin to Skin.  Wyvonnia DuskyLAWSON, Erandi Lemma DARLENE 08/10/2013, 8:58 AM

## 2013-08-09 ENCOUNTER — Encounter (HOSPITAL_COMMUNITY): Payer: Self-pay

## 2013-08-09 ENCOUNTER — Inpatient Hospital Stay (HOSPITAL_COMMUNITY)
Admission: AD | Admit: 2013-08-09 | Discharge: 2013-08-12 | DRG: 775 | Disposition: A | Discharge status: Home / Self Care | Payer: Medicaid Other | Source: Ambulatory Visit | Attending: Family Medicine | Admitting: Family Medicine

## 2013-08-09 VITALS — BP 124/85 | HR 74 | Temp 97.8°F | Resp 20 | Ht 70.0 in | Wt 277.0 lb

## 2013-08-09 DIAGNOSIS — O99322 Drug use complicating pregnancy, second trimester: Secondary | ICD-10-CM | POA: Diagnosis present

## 2013-08-09 DIAGNOSIS — E039 Hypothyroidism, unspecified: Secondary | ICD-10-CM | POA: Diagnosis present

## 2013-08-09 DIAGNOSIS — O99212 Obesity complicating pregnancy, second trimester: Secondary | ICD-10-CM

## 2013-08-09 DIAGNOSIS — O480 Post-term pregnancy: Principal | ICD-10-CM | POA: Diagnosis present

## 2013-08-09 DIAGNOSIS — O99214 Obesity complicating childbirth: Secondary | ICD-10-CM

## 2013-08-09 DIAGNOSIS — E079 Disorder of thyroid, unspecified: Secondary | ICD-10-CM | POA: Diagnosis present

## 2013-08-09 DIAGNOSIS — O99344 Other mental disorders complicating childbirth: Secondary | ICD-10-CM | POA: Diagnosis present

## 2013-08-09 DIAGNOSIS — E669 Obesity, unspecified: Secondary | ICD-10-CM | POA: Diagnosis present

## 2013-08-09 DIAGNOSIS — O321XX0 Maternal care for breech presentation, not applicable or unspecified: Secondary | ICD-10-CM | POA: Diagnosis present

## 2013-08-09 DIAGNOSIS — O0990 Supervision of high risk pregnancy, unspecified, unspecified trimester: Secondary | ICD-10-CM

## 2013-08-09 DIAGNOSIS — Z3490 Encounter for supervision of normal pregnancy, unspecified, unspecified trimester: Secondary | ICD-10-CM

## 2013-08-09 DIAGNOSIS — O99334 Smoking (tobacco) complicating childbirth: Secondary | ICD-10-CM | POA: Diagnosis present

## 2013-08-09 DIAGNOSIS — F1410 Cocaine abuse, uncomplicated: Secondary | ICD-10-CM | POA: Diagnosis present

## 2013-08-09 DIAGNOSIS — O99333 Smoking (tobacco) complicating pregnancy, third trimester: Secondary | ICD-10-CM

## 2013-08-09 DIAGNOSIS — O99284 Endocrine, nutritional and metabolic diseases complicating childbirth: Secondary | ICD-10-CM

## 2013-08-09 LAB — CBC
HCT: 28.1 % — ABNORMAL LOW (ref 36.0–46.0)
HEMOGLOBIN: 9.1 g/dL — AB (ref 12.0–15.0)
MCH: 26.2 pg (ref 26.0–34.0)
MCHC: 32.4 g/dL (ref 30.0–36.0)
MCV: 81 fL (ref 78.0–100.0)
Platelets: 212 10*3/uL (ref 150–400)
RBC: 3.47 MIL/uL — ABNORMAL LOW (ref 3.87–5.11)
RDW: 14.5 % (ref 11.5–15.5)
WBC: 8.1 10*3/uL (ref 4.0–10.5)

## 2013-08-09 LAB — TYPE AND SCREEN
ABO/RH(D): A POS
Antibody Screen: NEGATIVE

## 2013-08-09 LAB — RPR: RPR: NONREACTIVE

## 2013-08-09 MED ORDER — OXYTOCIN BOLUS FROM INFUSION
500.0000 mL | INTRAVENOUS | Status: DC
  Administered 2013-08-10: 500 mL via INTRAVENOUS

## 2013-08-09 MED ORDER — MISOPROSTOL 25 MCG QUARTER TABLET
25.0000 ug | ORAL_TABLET | ORAL | Status: DC | PRN
  Administered 2013-08-09: 25 ug via VAGINAL
  Filled 2013-08-09: qty 0.25
  Filled 2013-08-09: qty 1
  Filled 2013-08-09: qty 0.25

## 2013-08-09 MED ORDER — BUPRENORPHINE HCL 8 MG SL SUBL
8.0000 mg | SUBLINGUAL_TABLET | Freq: Three times a day (TID) | SUBLINGUAL | Status: DC | PRN
  Administered 2013-08-11 – 2013-08-12 (×3): 8 mg via SUBLINGUAL
  Filled 2013-08-09 (×3): qty 1

## 2013-08-09 MED ORDER — OXYTOCIN 40 UNITS IN LACTATED RINGERS INFUSION - SIMPLE MED
1.0000 m[IU]/min | INTRAVENOUS | Status: DC
  Administered 2013-08-09: 6 m[IU]/min via INTRAVENOUS
  Administered 2013-08-09: 2 m[IU]/min via INTRAVENOUS
  Administered 2013-08-09: 4 m[IU]/min via INTRAVENOUS
  Administered 2013-08-10: 8 m[IU]/min via INTRAVENOUS

## 2013-08-09 MED ORDER — OXYTOCIN 40 UNITS IN LACTATED RINGERS INFUSION - SIMPLE MED
62.5000 mL/h | INTRAVENOUS | Status: DC
  Filled 2013-08-09: qty 1000

## 2013-08-09 MED ORDER — TERBUTALINE SULFATE 1 MG/ML IJ SOLN: 0.2500 mg | Freq: Once | INTRAMUSCULAR | Status: AC | PRN

## 2013-08-09 MED ORDER — ALBUTEROL SULFATE HFA 108 (90 BASE) MCG/ACT IN AERS
2.0000 | INHALATION_SPRAY | Freq: Two times a day (BID) | RESPIRATORY_TRACT | Status: DC | PRN
  Administered 2013-08-10: 2 via RESPIRATORY_TRACT
  Filled 2013-08-09: qty 6.7

## 2013-08-09 MED ORDER — ACETAMINOPHEN 325 MG PO TABS: 650.0000 mg | ORAL_TABLET | ORAL | Status: DC | PRN

## 2013-08-09 MED ORDER — OXYCODONE-ACETAMINOPHEN 5-325 MG PO TABS: 1.0000 | ORAL_TABLET | ORAL | Status: DC | PRN

## 2013-08-09 MED ORDER — LACTATED RINGERS IV SOLN
500.0000 mL | INTRAVENOUS | Status: DC | PRN
  Administered 2013-08-10: 500 mL via INTRAVENOUS
  Administered 2013-08-10: 350 mL via INTRAVENOUS

## 2013-08-09 MED ORDER — ONDANSETRON HCL 4 MG/2ML IJ SOLN
4.0000 mg | Freq: Four times a day (QID) | INTRAMUSCULAR | Status: DC | PRN
  Administered 2013-08-10: 4 mg via INTRAVENOUS
  Filled 2013-08-09: qty 2

## 2013-08-09 MED ORDER — CITRIC ACID-SODIUM CITRATE 334-500 MG/5ML PO SOLN: 30.0000 mL | ORAL | Status: DC | PRN

## 2013-08-09 MED ORDER — FLEET ENEMA 7-19 GM/118ML RE ENEM: 1.0000 | ENEMA | Freq: Once | RECTAL | Status: DC

## 2013-08-09 MED ORDER — IBUPROFEN 600 MG PO TABS
600.0000 mg | ORAL_TABLET | Freq: Four times a day (QID) | ORAL | Status: DC | PRN
  Administered 2013-08-10: 600 mg via ORAL
  Filled 2013-08-09: qty 1

## 2013-08-09 MED ORDER — LACTATED RINGERS IV SOLN: INTRAVENOUS | Status: DC

## 2013-08-09 MED ORDER — LIDOCAINE HCL (PF) 1 % IJ SOLN
30.0000 mL | INTRAMUSCULAR | Status: DC | PRN
  Filled 2013-08-09: qty 30

## 2013-08-09 MED ORDER — ALBUTEROL SULFATE HFA 108 (90 BASE) MCG/ACT IN AERS
2.0000 | INHALATION_SPRAY | Freq: Four times a day (QID) | RESPIRATORY_TRACT | Status: DC | PRN
  Filled 2013-08-09: qty 6.7

## 2013-08-09 NOTE — H&P (Signed)
Conrad BurlingtonMeghann [name] is a 33 y.o. female [phone] @ 6449w0d  presenting for IOL due to post-term pregnancy. Maternal Medical History:  Reason for admission: Nausea.    OB History   Grav Para Term Preterm Abortions TAB SAB Ect Mult Living   5 2 2  2  2   2      Past Medical History  Diagnosis Date  . Hypothyroidism     no meds  . Abnormal Pap smear   . Asthma   . History of drug abuse     codiene  . Miscarriage   . Herniated lumbar intervertebral disc   . Obesity    Past Surgical History  Procedure Laterality Date  . Cholecystectomy    . Myringostomy Bilateral    Family History: family history includes Cancer in her maternal grandfather and mother; Diabetes in her maternal aunt, maternal grandmother, mother, paternal grandfather, and paternal grandmother. There is no history of Other.  Social History:  reports that she has been smoking Cigarettes.  She has been smoking about 0.50 packs per day. She has never used smokeless tobacco. She reports that she does not drink alcohol or use illicit drugs.    Review of Systems  Constitutional: Negative for fever and chills.  HENT: Negative for congestion.   Eyes: Negative for photophobia.  Respiratory: Negative for shortness of breath.   Cardiovascular: Positive for leg swelling. Negative for chest pain.  Gastrointestinal: Positive for constipation. Negative for nausea and vomiting.  Genitourinary: Negative for dysuria.  Skin: Negative for rash.  Neurological: Negative for dizziness, tingling, weakness and headaches.  Psychiatric/Behavioral: Negative for depression.    Dilation: 2 Effacement (%): 40 Station: -3 Exam by:: S Grindstaff RN Blood pressure 135/81, pulse 89, temperature 98.1 F (36.7 C), temperature source Oral, resp. rate 16, height 5\' 10"  (1.778 m), weight 125.646 kg (277 lb), last menstrual period 10/19/2012. Maternal Exam:  Uterine Assessment: Contraction strength is mild.  Contraction frequency is irregular.    Introitus: Vagina is negative for discharge.  Pelvis: adequate for delivery.   Cervix: Cervix evaluated by digital exam.     Fetal Exam Fetal Monitor Review: Baseline rate: 120.  Variability: moderate (6-25 bpm).   Pattern: accelerations present and no decelerations.    Fetal State Assessment: Category I - tracings are normal.    Dilation: 2 Effacement (%): 40 Station: -3 Presentation: Vertex Exam by:: Delrae SawyersS Grindstaff RN  Physical Exam  Vitals reviewed. Constitutional: She appears well-developed and well-nourished. No distress.  obese  HENT:  Head: Normocephalic and atraumatic.  Eyes: No scleral icterus.  Neck: Neck supple.  Cardiovascular: Normal rate and regular rhythm.   Respiratory: Effort normal.  GI: Soft. There is no tenderness.  Genitourinary: No vaginal discharge found.  Musculoskeletal: Normal range of motion. She exhibits no tenderness.  Neurological: She is alert.  Skin: Skin is warm and dry. No rash noted.  Psychiatric: She has a normal mood and affect.    Prenatal labs: ABO, Rh: A/POS/-- (08/25 0954) Antibody: NEG (08/25 0954) Rubella: 1.10 (08/25 0954) RPR: NON REAC (11/24 1040)  HBsAg: NEGATIVE (08/25 0954)  HIV: NON REACTIVE (11/24 1040)  GBS: Negative (11/26 0000)   Assessment/Plan: 1. Unspecified high-risk pregnancy   2. Maternal drug use complicating pregnancy in second trimester, antepartum   3. Obesity complicating pregnancy in second trimester   4. Tobacco use complicating pregnancy, third trimester    Given unfavorable cervix will begin with Cytotec for ripening. SW consult pp.  [name] [name] S 08/09/2013, 5:29 PM
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
## 2013-08-10 ENCOUNTER — Encounter (HOSPITAL_COMMUNITY): Payer: Self-pay

## 2013-08-10 ENCOUNTER — Encounter (HOSPITAL_COMMUNITY): Payer: Medicaid Other | Admitting: Anesthesiology

## 2013-08-10 ENCOUNTER — Inpatient Hospital Stay (HOSPITAL_COMMUNITY): Payer: Medicaid Other | Admitting: Anesthesiology

## 2013-08-10 DIAGNOSIS — O99214 Obesity complicating childbirth: Secondary | ICD-10-CM

## 2013-08-10 DIAGNOSIS — E669 Obesity, unspecified: Secondary | ICD-10-CM

## 2013-08-10 DIAGNOSIS — E039 Hypothyroidism, unspecified: Secondary | ICD-10-CM

## 2013-08-10 DIAGNOSIS — O480 Post-term pregnancy: Secondary | ICD-10-CM

## 2013-08-10 MED ORDER — PHENYLEPHRINE 40 MCG/ML (10ML) SYRINGE FOR IV PUSH (FOR BLOOD PRESSURE SUPPORT)
80.0000 ug | PREFILLED_SYRINGE | INTRAVENOUS | Status: DC | PRN
  Filled 2013-08-10: qty 10
  Filled 2013-08-10: qty 2

## 2013-08-10 MED ORDER — PHENYLEPHRINE 40 MCG/ML (10ML) SYRINGE FOR IV PUSH (FOR BLOOD PRESSURE SUPPORT)
80.0000 ug | PREFILLED_SYRINGE | INTRAVENOUS | Status: DC | PRN
  Filled 2013-08-10: qty 2

## 2013-08-10 MED ORDER — OXYTOCIN 40 UNITS IN LACTATED RINGERS INFUSION - SIMPLE MED: 62.5000 mL/h | INTRAVENOUS | Status: DC | PRN

## 2013-08-10 MED ORDER — DIPHENHYDRAMINE HCL 25 MG PO CAPS: 25.0000 mg | ORAL_CAPSULE | Freq: Four times a day (QID) | ORAL | Status: DC | PRN

## 2013-08-10 MED ORDER — EPHEDRINE 5 MG/ML INJ
10.0000 mg | INTRAVENOUS | Status: DC | PRN
  Filled 2013-08-10: qty 2
  Filled 2013-08-10: qty 4

## 2013-08-10 MED ORDER — SODIUM CHLORIDE 0.9 % IV SOLN: 250.0000 mL | INTRAVENOUS | Status: DC | PRN

## 2013-08-10 MED ORDER — ONDANSETRON HCL 4 MG/2ML IJ SOLN: 4.0000 mg | INTRAMUSCULAR | Status: DC | PRN

## 2013-08-10 MED ORDER — FENTANYL 2.5 MCG/ML BUPIVACAINE 1/10 % EPIDURAL INFUSION (WH - ANES)
14.0000 mL/h | INTRAMUSCULAR | Status: DC | PRN
  Administered 2013-08-10: 14 mL/h via EPIDURAL
  Filled 2013-08-10: qty 125

## 2013-08-10 MED ORDER — ONDANSETRON HCL 4 MG PO TABS: 4.0000 mg | ORAL_TABLET | ORAL | Status: DC | PRN

## 2013-08-10 MED ORDER — PRENATAL MULTIVITAMIN CH
1.0000 | ORAL_TABLET | Freq: Every day | ORAL | Status: DC
  Administered 2013-08-11 – 2013-08-12 (×2): 1 via ORAL
  Filled 2013-08-10 (×2): qty 1

## 2013-08-10 MED ORDER — LANOLIN HYDROUS EX OINT: TOPICAL_OINTMENT | CUTANEOUS | Status: DC | PRN

## 2013-08-10 MED ORDER — SODIUM CHLORIDE 0.9 % IJ SOLN
3.0000 mL | Freq: Two times a day (BID) | INTRAMUSCULAR | Status: DC
Start: 2013-08-10 — End: 2013-08-12

## 2013-08-10 MED ORDER — ZOLPIDEM TARTRATE 5 MG PO TABS: 5.0000 mg | ORAL_TABLET | Freq: Every evening | ORAL | Status: DC | PRN

## 2013-08-10 MED ORDER — DIPHENHYDRAMINE HCL 50 MG/ML IJ SOLN: 12.5000 mg | INTRAMUSCULAR | Status: DC | PRN

## 2013-08-10 MED ORDER — OXYCODONE-ACETAMINOPHEN 5-325 MG PO TABS
1.0000 | ORAL_TABLET | ORAL | Status: DC | PRN
  Filled 2013-08-10: qty 1

## 2013-08-10 MED ORDER — BUPIVACAINE HCL (PF) 0.25 % IJ SOLN
INTRAMUSCULAR | Status: DC | PRN
  Administered 2013-08-10: 2 mL

## 2013-08-10 MED ORDER — EPHEDRINE 5 MG/ML INJ
10.0000 mg | INTRAVENOUS | Status: DC | PRN
  Filled 2013-08-10: qty 2

## 2013-08-10 MED ORDER — SENNOSIDES-DOCUSATE SODIUM 8.6-50 MG PO TABS
2.0000 | ORAL_TABLET | ORAL | Status: DC
  Administered 2013-08-11 (×2): 2 via ORAL
  Filled 2013-08-10 (×2): qty 2

## 2013-08-10 MED ORDER — SODIUM BICARBONATE 8.4 % IV SOLN
INTRAVENOUS | Status: DC | PRN
  Administered 2013-08-10: 4 mL via EPIDURAL
  Administered 2013-08-10: 5 mL via EPIDURAL

## 2013-08-10 MED ORDER — LACTATED RINGERS IV SOLN
500.0000 mL | Freq: Once | INTRAVENOUS | Status: DC
Start: 2013-08-10 — End: 2013-08-10

## 2013-08-10 MED ORDER — DIBUCAINE 1 % RE OINT
1.0000 "application " | TOPICAL_OINTMENT | RECTAL | Status: DC | PRN
  Administered 2013-08-10: 1 via RECTAL
  Filled 2013-08-10: qty 28

## 2013-08-10 MED ORDER — WITCH HAZEL-GLYCERIN EX PADS
1.0000 "application " | MEDICATED_PAD | CUTANEOUS | Status: DC | PRN
  Administered 2013-08-10: 1 via TOPICAL

## 2013-08-10 MED ORDER — IBUPROFEN 600 MG PO TABS
600.0000 mg | ORAL_TABLET | Freq: Four times a day (QID) | ORAL | Status: DC
  Administered 2013-08-10 – 2013-08-12 (×8): 600 mg via ORAL
  Filled 2013-08-10 (×8): qty 1

## 2013-08-10 MED ORDER — TETANUS-DIPHTH-ACELL PERTUSSIS 5-2.5-18.5 LF-MCG/0.5 IM SUSP: 0.5000 mL | Freq: Once | INTRAMUSCULAR | Status: DC

## 2013-08-10 MED ORDER — BENZOCAINE-MENTHOL 20-0.5 % EX AERO
1.0000 "application " | INHALATION_SPRAY | CUTANEOUS | Status: DC | PRN
  Administered 2013-08-10: 1 via TOPICAL
  Filled 2013-08-10: qty 56

## 2013-08-10 MED ORDER — SODIUM CHLORIDE 0.9 % IJ SOLN: 3.0000 mL | INTRAMUSCULAR | Status: DC | PRN

## 2013-08-10 MED ORDER — SIMETHICONE 80 MG PO CHEW: 80.0000 mg | CHEWABLE_TABLET | ORAL | Status: DC | PRN

## 2013-08-10 NOTE — Anesthesia Postprocedure Evaluation (Signed)
  Anesthesia Post-op Note  Patient: [name] BurlingtonMeghann [name]  Procedure(s) Performed: * No procedures listed *  Patient Location: Mother/Baby  Anesthesia Type:Epidural  Level of Consciousness: awake  Airway and Oxygen Therapy: Patient Spontanous Breathing  Post-op Pain: mild  Post-op Assessment: Patient's Cardiovascular Status Stable and Respiratory Function Stable  Post-op Vital Signs: stable  Complications: No apparent anesthesia complications

## 2013-08-10 NOTE — Anesthesia Preprocedure Evaluation (Signed)
Anesthesia Evaluation  Patient identified by MRN, date of birth, ID band Patient awake    Reviewed: Allergy & Precautions, H&P , Patient's Chart, lab work & pertinent test results  Airway Mallampati: II TM Distance: >3 FB Neck ROM: full    Dental  (+) Teeth Intact   Pulmonary asthma , Current Smoker,  breath sounds clear to auscultation        Cardiovascular Rhythm:regular Rate:Normal     Neuro/Psych    GI/Hepatic   Endo/Other  Morbid obesity  Renal/GU      Musculoskeletal   Abdominal   Peds  Hematology  (+) anemia ,   Anesthesia Other Findings       Reproductive/Obstetrics (+) Pregnancy                           Anesthesia Physical Anesthesia Plan  ASA: III  Anesthesia Plan: Epidural   Post-op Pain Management:    Induction:   Airway Management Planned:   Additional Equipment:   Intra-op Plan:   Post-operative Plan:   Informed Consent: I have reviewed the patients History and Physical, chart, labs and discussed the procedure including the risks, benefits and alternatives for the proposed anesthesia with the patient or authorized representative who has indicated his/her understanding and acceptance.   Dental Advisory Given  Plan Discussed with:   Anesthesia Plan Comments: (Labs checked- platelets confirmed with RN in room. Fetal heart tracing, per RN, reported to be stable enough for sitting procedure. Discussed epidural, and patient consents to the procedure:  included risk of possible headache,backache, failed block, allergic reaction, and nerve injury. This patient was asked if she had any questions or concerns before the procedure started.)        Anesthesia Quick Evaluation

## 2013-08-10 NOTE — Anesthesia Procedure Notes (Signed)

## 2013-08-10 NOTE — Progress Notes (Signed)
[name] BurlingtonMeghann [name] is a 33 y.o. [id number] at 5532w1d by ultrasound admitted for induction of labor due to Post dates. Due date 12/27.  Subjective:   Objective: BP 120/78  Pulse 64  Temp(Src) 97.8 F (36.6 C) (Oral)  Resp 18  Ht 5\' 10"  (1.778 m)  Wt 277 lb (125.646 kg)  BMI 39.75 kg/m2  SpO2 94%  LMP 10/19/2012      FHT:  FHR: 140's bpm, variability: moderate,  accelerations:  Present,  decelerations:  Absent UC:   regular, every 2 minutes SVE:   Dilation: 7 Effacement (%): 100 Station: -1 Exam by:: lawson. D  Labs: Lab Results  Component Value Date   WBC 8.1 08/09/2013   HGB 9.1* 08/09/2013   HCT 28.1* 08/09/2013   MCV 81.0 08/09/2013   PLT 212 08/09/2013    Assessment / Plan: Induction of labor due to postterm,  progressing well on pitocin  Labor: Progressing normally Preeclampsia:  no signs or symptoms of toxicity Fetal Wellbeing:  Category I Pain Control:  Epidural I/D:  n/a Anticipated MOD:  NSVD  LAWSON, MARIE DARLENE 08/10/2013, 6:36 AM

## 2013-08-11 NOTE — Progress Notes (Signed)
Post Partum Day 1 Subjective: no complaints, up ad lib, voiding and tolerating PO  Objective: Blood pressure 131/82, pulse 72, temperature 97.8 F (36.6 C), temperature source Oral, resp. rate 18, height 5\' 10"  (1.778 m), weight 125.646 kg (277 lb), last menstrual period 10/19/2012, SpO2 100.00%, unknown if currently breastfeeding.  Physical Exam:  General: alert, cooperative and no distress Lochia: appropriate Uterine Fundus: firm Incision: na DVT Evaluation: No evidence of DVT seen on physical exam. No cords or calf tenderness. No significant calf/ankle edema.   Recent Labs  08/09/13 1610  HGB 9.1*  HCT 28.1*    Assessment/Plan: Plan for discharge tomorrow, Breastfeeding, Lactation consult and Contraception mirena   LOS: 2 days   [name] [name] L 08/11/2013, 8:06 AM

## 2013-08-11 NOTE — Lactation Note (Signed)
This note was copied from the chart of [name] Conrad BurlingtonMeghann Macneal. Lactation Consultation Note  Patient Name: [name] [name] ZOXWR'UToday's Date: 08/11/2013 Reason for consult: Initial assessment Mom reports baby is getting a shallow latch and requests assist to obtain more depth. Reviewed with Mom positioning and how to support breast for better latch. Mom has been using breast compression at base of nipple, demonstrated how to use "tea cup" hold so baby can obtain more depth. Baby has recessed chin and does not open her mouth wide. Mom reports baby does not like formula, she is using hand pump and receiving some colostrum which she reports she will give back to baby. Flanged changed to size 30 for comfort also given size 27. BF basics reviewed with Mom, reviewed cluster feeding. Mom reports she has history of low milk supply with 1st 2 children, history of hypothyroid. Advised to have thyroid checked to be sure TSH WNL. Discussed importance of frequent breastfeeding to milk production, only supplementing if medically necessary. Mom given information on diet to support breast milk, info on Fenugreek supplements. Lactation brochure left for review, advised of OP services and support group.   Maternal Data Formula Feeding for Exclusion: Yes Reason for exclusion: Mother's choice to formula and breast feed on admission Infant to breast within first hour of birth: Yes Has patient been taught Hand Expression?: Yes Does the patient have breastfeeding experience prior to this delivery?: Yes  Feeding Feeding Type: Breast Fed Length of feed: 10 min  LATCH Score/Interventions Latch: Grasps breast easily, tongue down, lips flanged, rhythmical sucking. Intervention(s): Adjust position;Assist with latch;Breast massage;Breast compression  Audible Swallowing: A few with stimulation  Type of Nipple: Everted at rest and after stimulation  Comfort (Breast/Nipple): Soft / non-tender     Hold (Positioning): No  assistance needed to correctly position infant at breast.  LATCH Score: 9  Lactation Tools Discussed/Used Tools: Pump;Flanges Flange Size: 30 Breast pump type: Manual   Consult Status Consult Status: Follow-up Date: 08/12/13 Follow-up type: In-patient    Alfred LevinsGranger, Bobbi Yount Ann 08/11/2013, 3:53 PM

## 2013-08-11 NOTE — Progress Notes (Signed)
UR chart review completed.  
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
## 2013-08-12 MED ORDER — IBUPROFEN 600 MG PO TABS: 600.0000 mg | ORAL_TABLET | Freq: Four times a day (QID) | ORAL | Status: AC

## 2013-08-12 NOTE — Discharge Summary (Signed)
Obstetric Discharge Summary Reason for Admission: induction of labor for postdates Prenatal Procedures: NST Intrapartum Procedures: spontaneous vaginal delivery Postpartum Procedures: none Complications-Operative and Postpartum: none Hemoglobin  Date Value Range Status  08/09/2013 9.1* 12.0 - 15.0 g/dL Final     HCT  Date Value Range Status  08/09/2013 28.1* 36.0 - 46.0 % Final    Physical Exam:  General: alert, cooperative and no distress Lochia: appropriate Uterine Fundus: firm Incision: na  DVT Evaluation: No evidence of DVT seen on physical exam. No cords or calf tenderness. No significant calf/ankle edema.  Discharge Diagnoses: Term Pregnancy-delivered  Discharge Information: Date: 08/12/2013 Activity: pelvic rest Diet: routine Medications: PNV and Ibuprofen Condition: stable Instructions: refer to practice specific booklet Discharge to: home Follow-up Information   [facility] 4 weeks. (for your postpartum visit and mirena placement)    Specialty:  Obstetrics and Gynecology   Contact information:   [address] [place] [id number] [phone]      Newborn Data: Live born female  Birth Weight: 6 lb 15.3 oz (3155 g) APGAR: 8, 9  Home with mother.  Pt presented as an IOL for postdates and progressed well to deliver a liveborn femal via NSVD. No complications. Postpartum care was uncomplicated. Pt hs a hx of drug abuse and is on subutex.  Baby did well however but will stay for observation for a few more days likely.  She is breast feeding exclusively and desires mirena for contraception.   [name] [name] 08/12/2013, 8:56 AM

## 2013-08-12 NOTE — Discharge Instructions (Signed)

## 2013-08-14 ENCOUNTER — Ambulatory Visit: Payer: Self-pay

## 2013-08-14 NOTE — Lactation Note (Signed)
This note was copied from the chart of [name] Conrad BurlingtonMeghann Wenzlick. Lactation Consultation Note  Patient Name: [name] [name] ZOXWR'UToday's Date: 08/14/2013 Reason for consult: Follow-up assessment [facility], breast are full and has been breast feeding and bottle feeding EBM Reviewed engorgement prevention and tx. [facility] mom [facility] -  [facility] and set up a time to obtain a DEBP loaner  Pump. Per mom had called them yesterday and they have a waiting list. [facility] pumps  Also and holding for NICU . Mom is able to pump off with a hand pump and feed the baby from the breast. Noted  The last 3 feedings have been from a bottle up 60 ml of EBM. LC encouraged mom to work on re-latching baby at  the breast when she gets home, also to call [facility] for a DEBP loaner as plan B. Mom has a large  am't pumped milk in the refrigerator ( Checked by 2 RN"s - [name] LusherMargaret Kaniel Kiang, RN , IBCLC and  [name] [name] )  Prior to D/C , placed on Ice for mom in a basin in a patient belonging bag. Due to mom having a dr. Boneta LucksApt at 12n and  Not going home right away. LC encouraged to call with Breast feeding questions or attend the BFSG.      Maternal Data    Feeding Feeding Type: Bottle Fed - Breast Milk Length of feed:  (EBM in a bottle , mom feeding EBM form a bottle )  LATCH Score/Interventions                Intervention(s): Breastfeeding basics reviewed (see LC note )     Lactation Tools Discussed/Used     Consult Status Consult Status: Complete    [name] [name], [name] [name] 08/14/2013, 11:41 AM

## 2013-08-19 ENCOUNTER — Telehealth: Payer: Self-pay | Admitting: Obstetrics & Gynecology

## 2013-08-19 NOTE — Telephone Encounter (Signed)
Pt is 5 days post partum.  Pt's home health nurse called to rport BPs 140s/100s, pitting edema and headaches.  Pt does not have a history of hypertension.  Home health RN was advised to have patient come to the MAU for pre eclampsia evaluation.

## 2013-09-04 ENCOUNTER — Encounter: Payer: Self-pay | Admitting: *Deleted

## 2014-06-08 ENCOUNTER — Encounter (HOSPITAL_COMMUNITY): Payer: Self-pay

## 2015-02-05 DEATH — deceased

## 2015-03-21 IMAGING — US US OB DETAIL+14 WK
1 series · 12 of 28 positions shown · non-contrast
Comparison: none

[Series 1: us ob detail +14 wk · 12 of 35 slices shown]
[im 2/35]
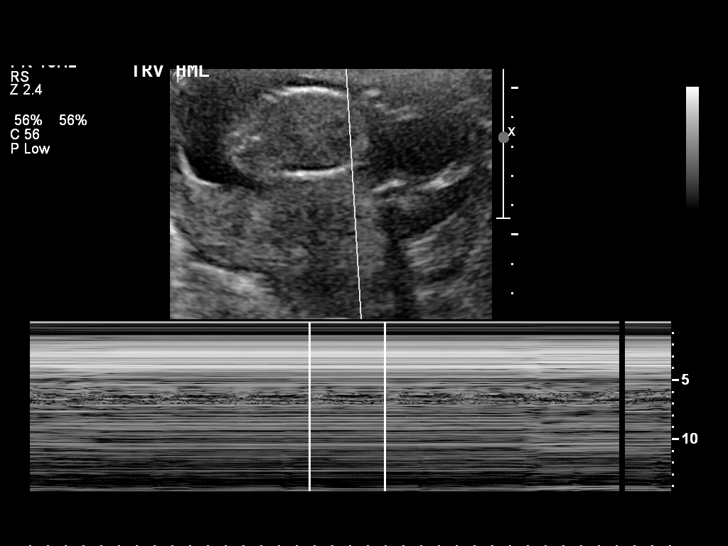
[im 4/35]
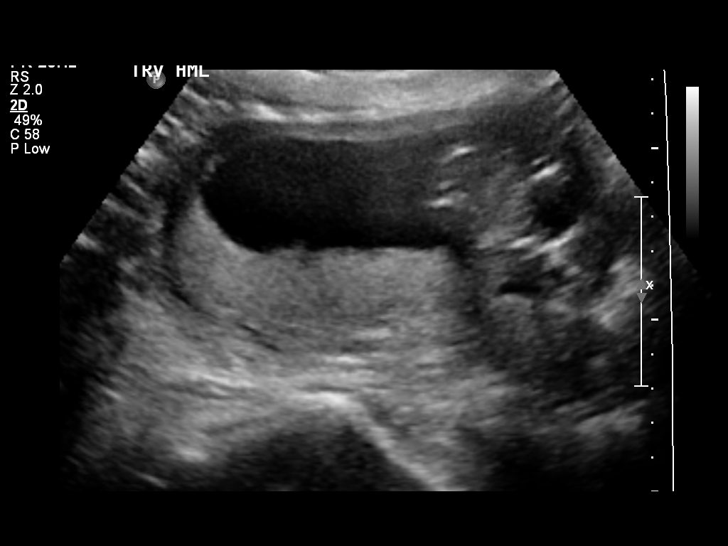
[im 7/35]
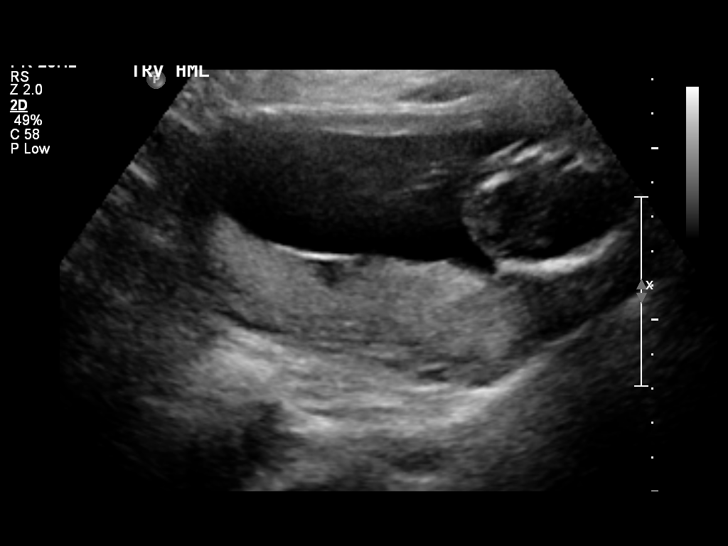
[im 11/35]
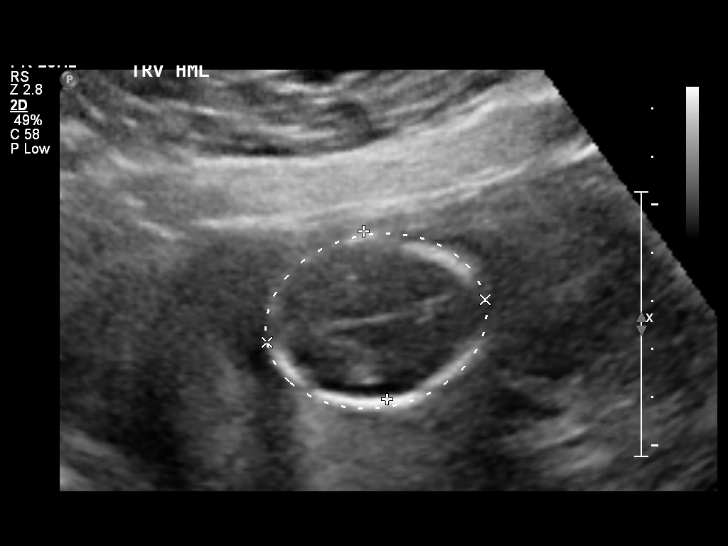
[im 13/35]
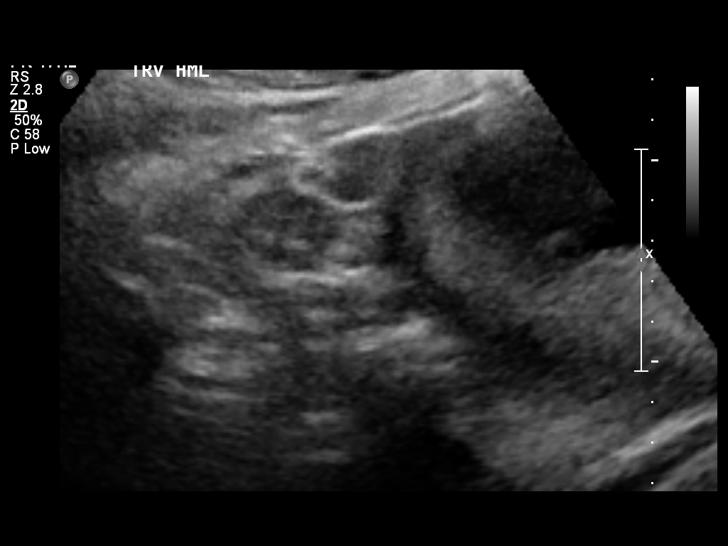
[im 16/35]
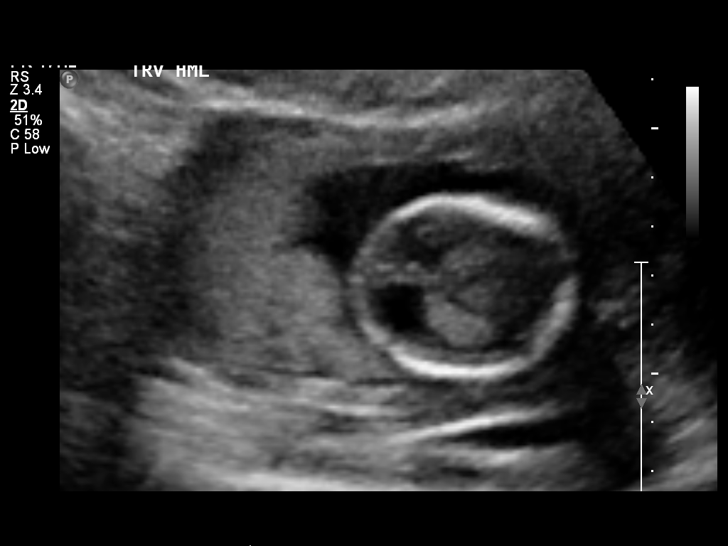
[im 19/35]
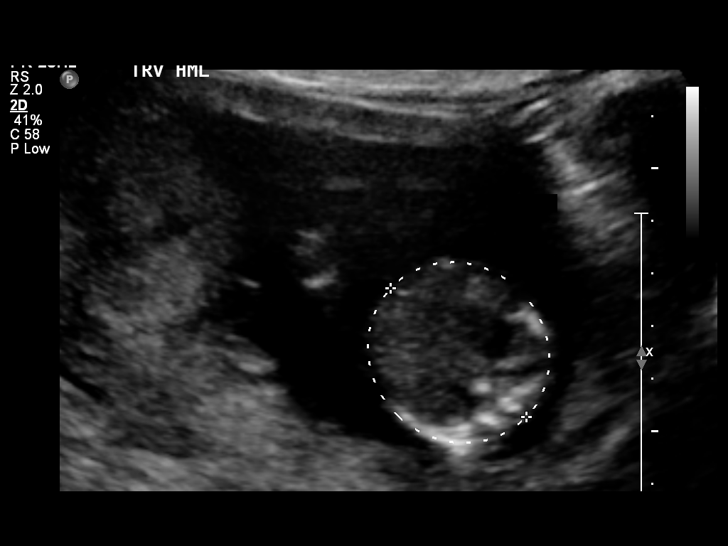
[im 22/35]
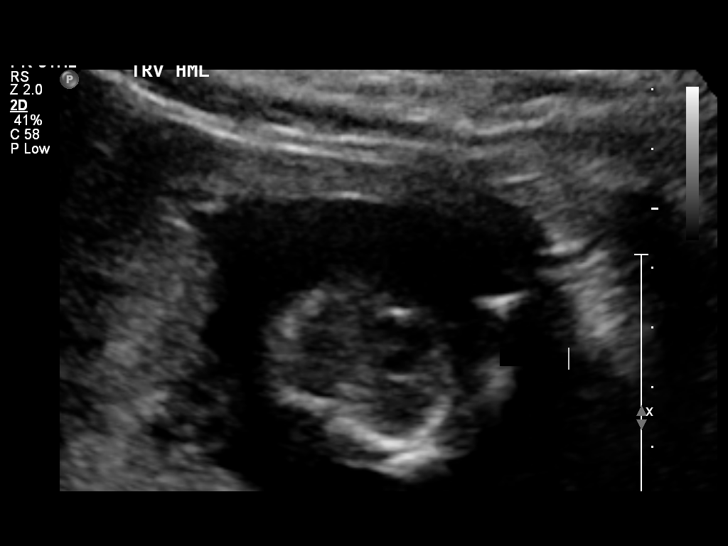
[im 24/35]
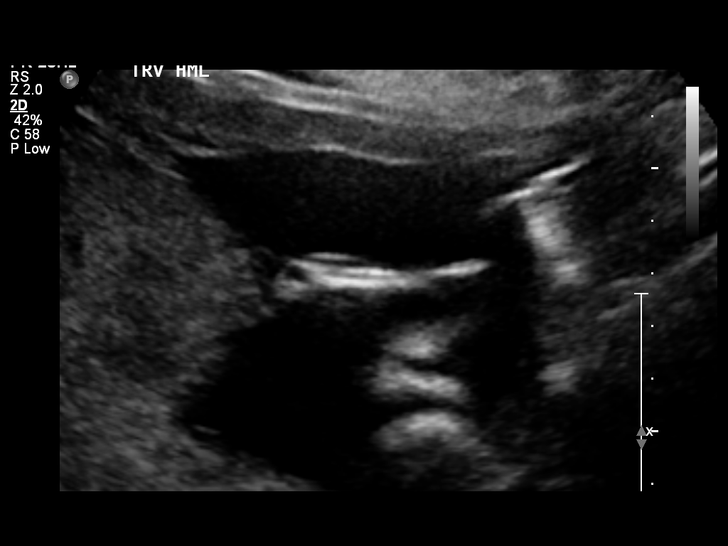
[im 28/35]
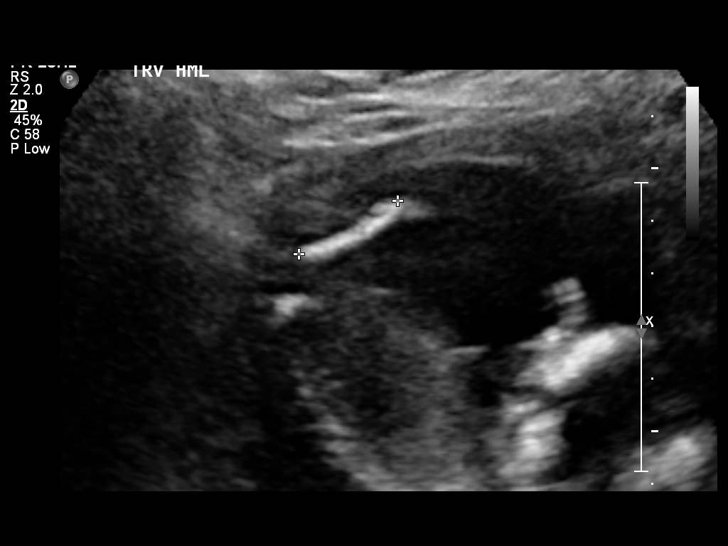
[im 31/35]
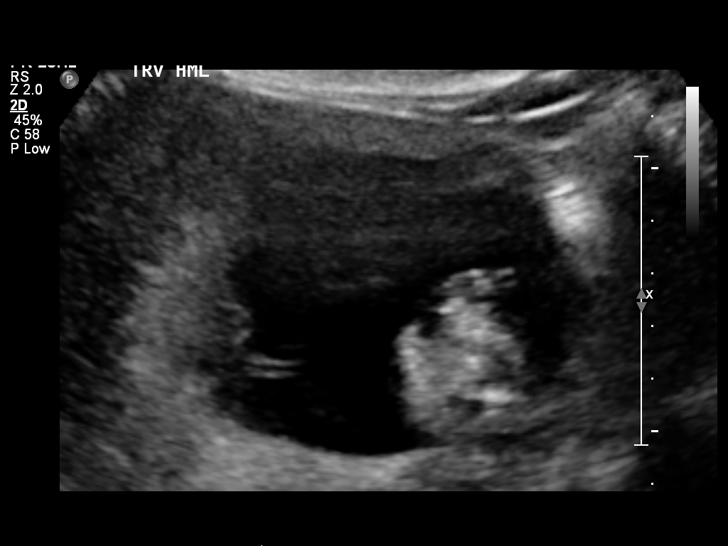
[im 33/35]
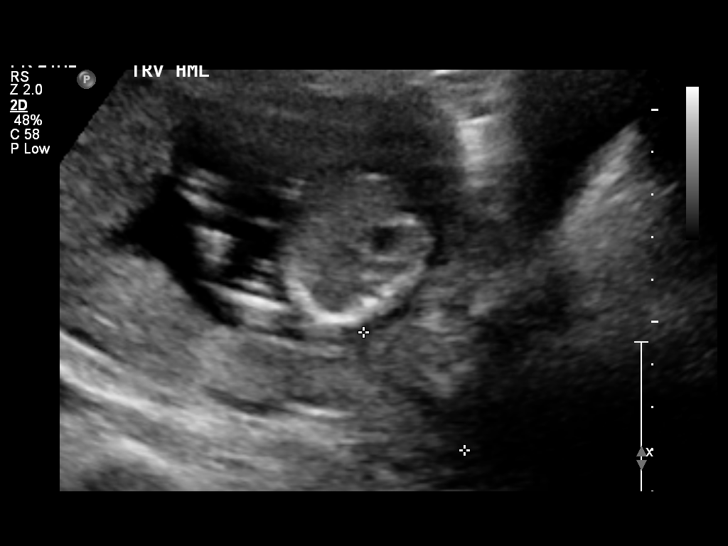

[12 of 28 positions shown; findings below may reference images not displayed]

OBSTETRICS REPORT
                      (Signed Final 02/19/2013 [DATE])

Service(s) Provided

 US OB DETAIL + 14 WK                                  76811.0
Indications

 Detailed fetal anatomic survey
 High risk pregnancy due to maternal drug abuse        648.40, 305.90,

 Size less than dates (Small for gestational [AGE]
 FGR)
Fetal Evaluation

 Num Of Fetuses:    1
 Preg. Location:    Intrauterine
 Fetal Heart Rate:  142                         bpm
 Cardiac Activity:  Observed
 Presentation:      Transverse, head to
                    maternal left
 Placenta:          Posterior, above cervical
                    os
 P. Cord            Visualized, central
 Insertion:

 Amniotic Fluid
 AFI FV:      Subjectively within normal limits
                                             Larg Pckt:   5.54   cm
 LUQ:   5.54   cm
Biometry

 BPD:     34.8  mm    G. Age:   16w 5d                CI:        73.37   70 - 86
                                                      FL/HC:      16.5   14.6 -

 HC:     129.1  mm    G. Age:   16w 4d       39  %    HC/AC:      1.20   1.07 -

 AC:     107.9  mm    G. Age:   16w 5d       54  %    FL/BPD:
 FL:      21.3  mm    G. Age:   16w 3d       38  %    FL/AC:      19.7   20 - 24
 HUM:     20.3  mm    G. Age:   16w 0d       41  %

 Est. FW:     160  gm      0 lb 6 oz     59  %
Gestational Age

 LMP:           17w 4d       Date:   10/19/12                 EDD:   07/26/13
 U/S Today:     16w 4d                                        EDD:   08/02/13
 Best:          16w 4d    Det. By:   U/S (02/19/13)           EDD:   08/02/13
Anatomy

 Cranium:          Appears normal         Aortic Arch:      Not well visualized
 Fetal Cavum:      Not well visualized    Ductal Arch:      Not well visualized
 Ventricles:       Not well visualized    Diaphragm:        Not well visualized
 Choroid Plexus:   Appears normal         Stomach:          Appears normal, left
                                                            sided
 Cerebellum:       Not well visualized    Abdomen:          Appears normal
 Posterior Fossa:  Not well visualized    Abdominal Wall:   Appears nml (cord
                                                            insert, abd wall)
 Nuchal Fold:      Not well visualized    Cord Vessels:     Not well visualized
 Face:             Not well visualized    Kidneys:          Not well visualized
 Lips:             Not well visualized    Bladder:          Appears normal
 Heart:            Not well visualized    Spine:            Not well visualized
 RVOT:             Not well visualized    Lower             Not well visualized
                                          Extremities:
 LVOT:             Not well visualized    Upper             Not well visualized
                                          Extremities:

 Other:  Technicallly difficult due to early GA and maternal habitus. Fetus
         appears to be a female.
Cervix Uterus Adnexa

 Cervical Length:   3.64      cm

 Cervix:       Normal appearance by transabdominal scan.
 Uterus:       No abnormality visualized.
 Cul De Sac:   No free fluid seen.

 Adnexa:     No abnormality visualized.
Impression

 Single living IUP with US Gest. Age of 16w 4d, and EDD of
 08/02/2013.
 Suboptimal evaluation of anatomy due to early GA, but no
 early fetal anomalies visualized.
 Normal amniotic fluid volume. Normal cervical length.
Recommendations

 Consider follow-up ultrasound to complete fetal anatomic
 evaluation in 3-4 weeks.

 questions or concerns.

## 2015-05-01 IMAGING — US US OB FOLLOW-UP
1 series · 12 of 28 positions shown · non-contrast
Comparison: none

[Series 1: us ob follow up · 12 of 74 slices shown]
[im 3/74]
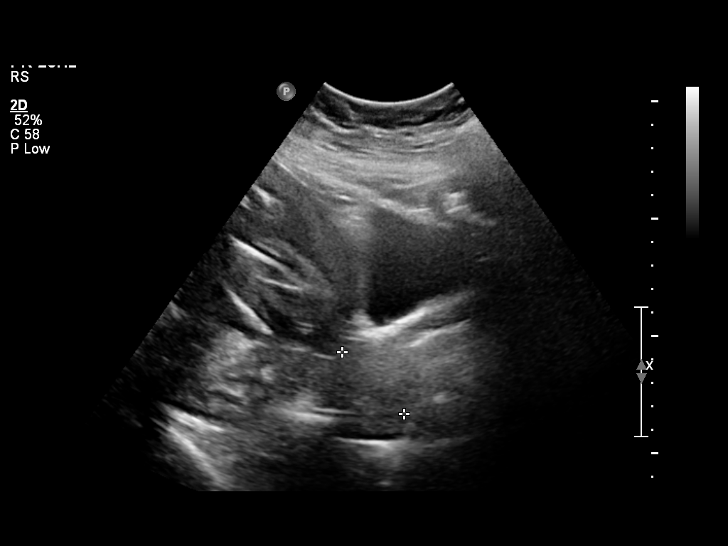
[im 9/74]
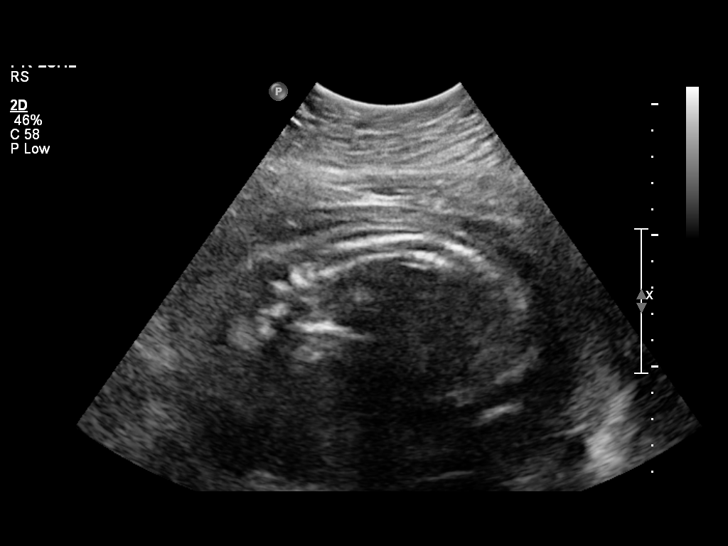
[im 14/74]
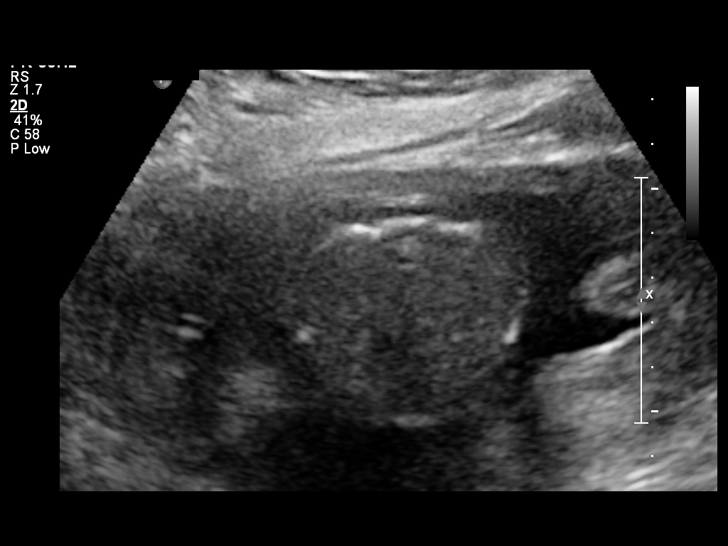
[im 22/74]
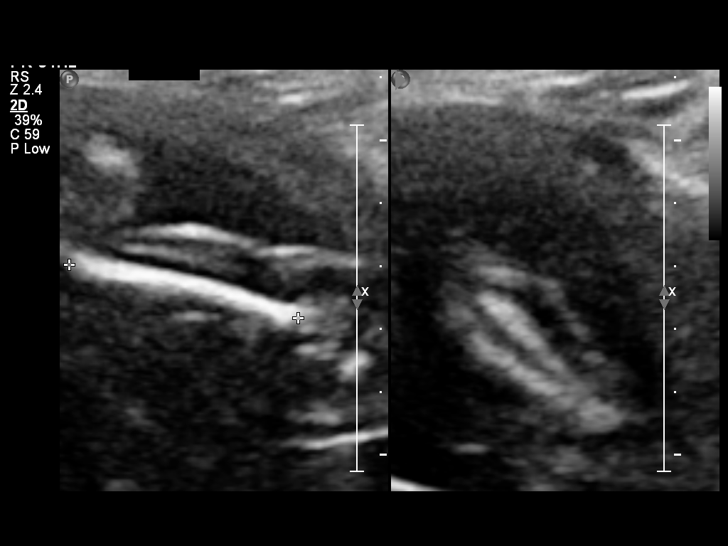
[im 28/74]
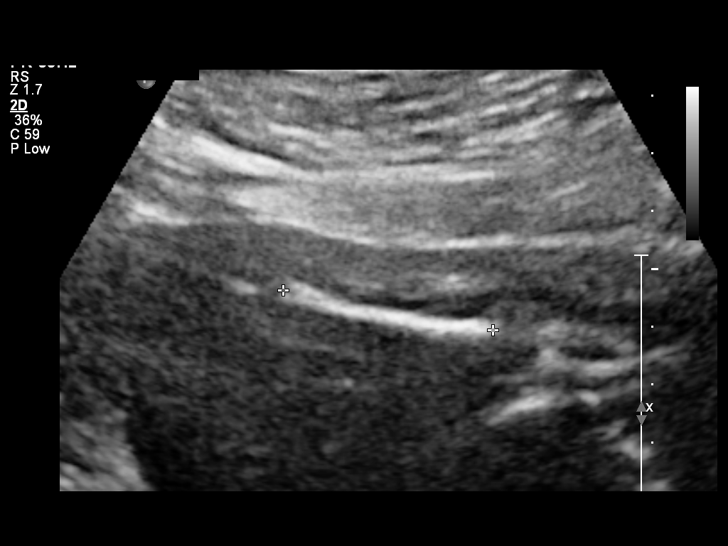
[im 33/74]
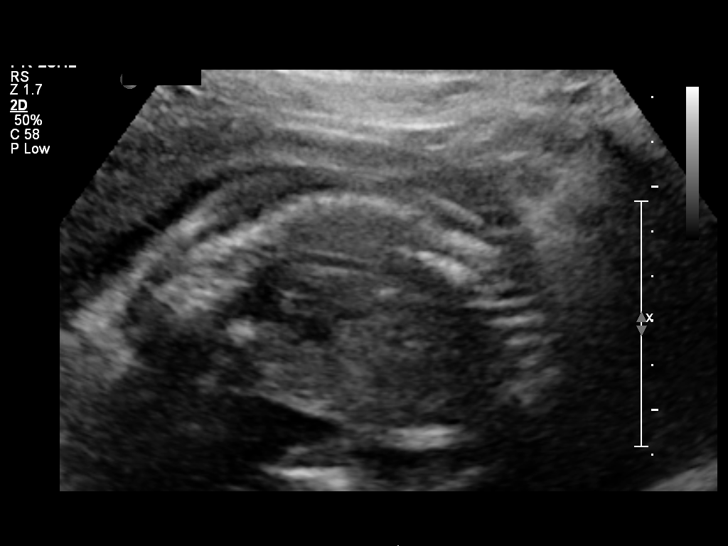
[im 41/74]
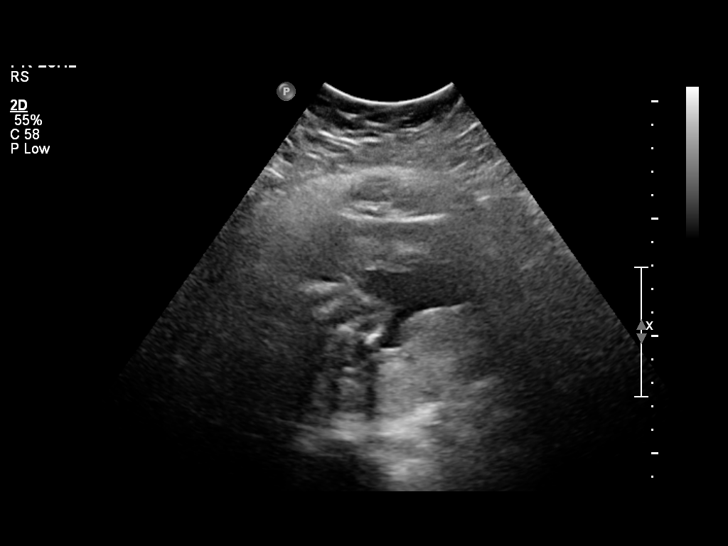
[im 46/74]
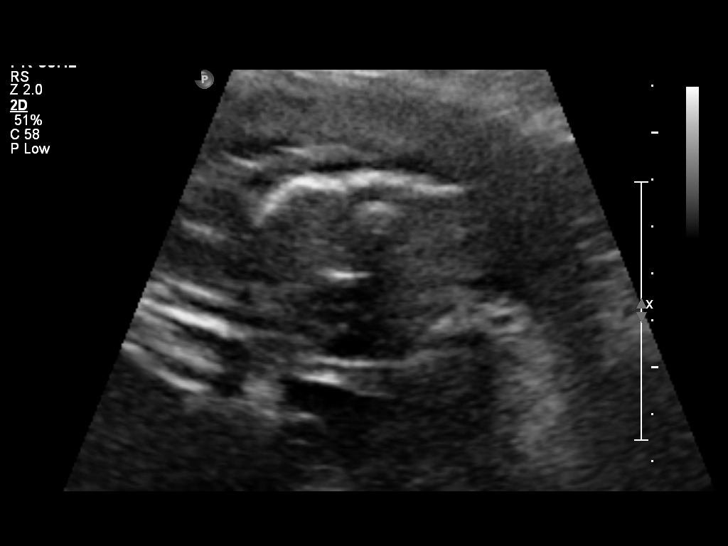
[im 52/74]
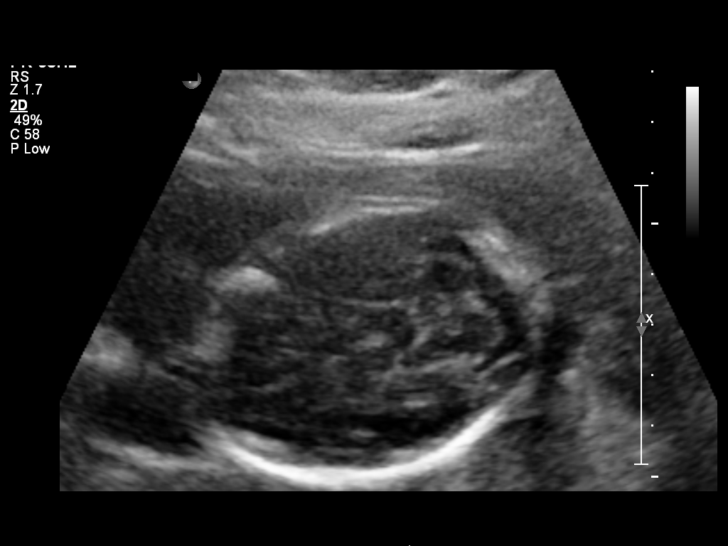
[im 60/74]
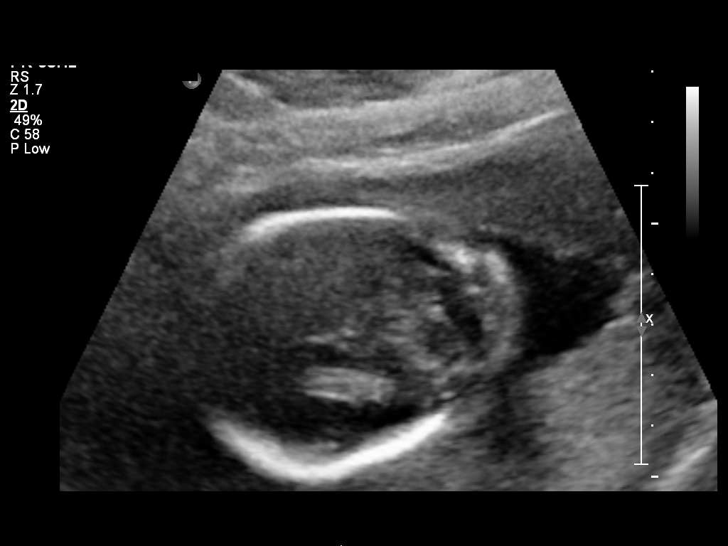
[im 65/74]
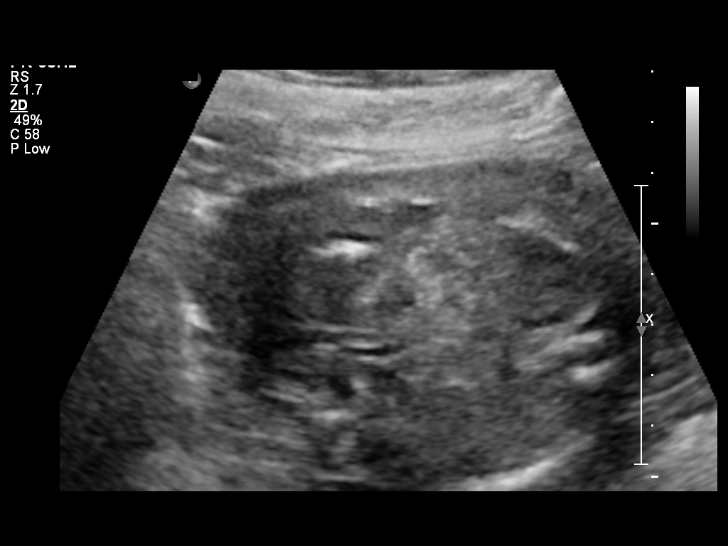
[im 71/74]
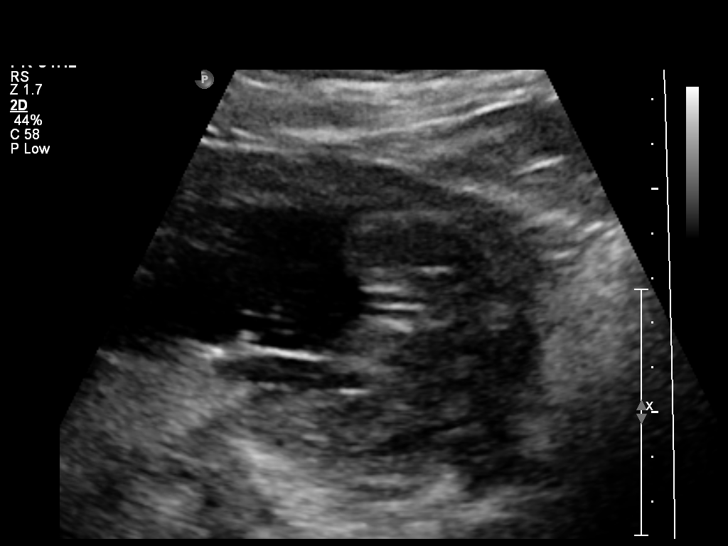

[12 of 28 positions shown; findings below may reference images not displayed]

OBSTETRICS REPORT
                      (Signed Final 04/01/2013 [DATE])

Service(s) Provided

 US OB FOLLOW UP                                       76816.1
Indications

 Follow-up incomplete fetal anatomic evaluation
 High risk pregnancy due to maternal drug abuse        648.40, 305.90,

Fetal Evaluation

 Num Of Fetuses:    1
 Fetal Heart Rate:  153                         bpm
 Cardiac Activity:  Observed
 Presentation:      Breech
 Placenta:          Fundal left , above
                    cervical os
 P. Cord            Visualized, central
 Insertion:

 Amniotic Fluid
 AFI FV:      Subjectively within normal limits
                                             Larg Pckt:   4.71   cm
Biometry

 BPD:     51.1  mm    G. Age:   21w 4d                CI:        73.15   70 - 86
                                                      FL/HC:      20.1   18.4 -

 HC:     189.9  mm    G. Age:   21w 2d        6  %    HC/AC:      1.06   1.06 -

 AC:     178.8  mm    G. Age:   22w 5d       53  %    FL/BPD:     74.6   71 - 87
 FL:      38.1  mm    G. Age:   22w 2d       32  %    FL/AC:      21.3   20 - 24
 HUM:     34.4  mm    G. Age:   21w 5d       31  %
 CER:     21.6  mm    G. Age:   20w 4d        8  %

 Est. FW:     495  gm      1 lb 1 oz     48  %
Gestational Age

 LMP:           23w 3d       Date:   10/19/12                 EDD:   07/26/13
 U/S Today:     22w 0d                                        EDD:   08/05/13
 Best:          22w 3d    Det. By:   U/S (02/19/13)           EDD:   08/02/13
Anatomy
 Cranium:          Appears normal         Aortic Arch:      Not well visualized
 Fetal Cavum:      Appears normal         Ductal Arch:      Not well visualized
 Ventricles:       Appears normal         Diaphragm:        Not well visualized
 Choroid Plexus:   Appears normal         Stomach:          Appears normal
 Cerebellum:       Appears normal         Abdomen:          Appears normal
 Posterior Fossa:  Appears normal         Abdominal Wall:   Not well visualized
 Nuchal Fold:      Not applicable (>20    Cord Vessels:     Appears normal (3
                   wks GA)                                  vessel cord)
 Face:             Orbits appear          Kidneys:          Appear normal
                   normal
 Lips:             Appears normal         Bladder:          Appears normal
 Heart:            Not well visualized    Spine:            Not well visualized
 RVOT:             Not well visualized    Lower             Appears normal
                                          Extremities:
 LVOT:             Not well visualized    Upper             Appears normal
                                          Extremities:

 Other:  Fetus appears to be a female. Technically difficult due to maternal
         habitus and fetal position.
Targeted Anatomy

 Fetal Central Nervous System
 Cisterna Magna:
Cervix Uterus Adnexa

 Cervical Length:   3.72      cm

 Cervix:       Normal appearance by transabdominal scan.
 Uterus:       No abnormality visualized.
 Cul De Sac:   No free fluid seen.

 Left Ovary:   Not visualized.
 Right Ovary:  Not visualized.
 Adnexa:     No abnormality visualized.
Impression

 Single live IUP in breech presentation.   Concordant
 measurements/assigned GA by US.
 No anomaly seen in visualized structures as listed above.
 Suboptimal visualization of multiple anatomic structures due
 to maternal body habitus. Consider reassessment in 1-2
 weeks.

 questions or concerns.
